# Patient Record
Sex: Female | Born: 1975 | Race: White | Hispanic: No | Smoking: Current every day smoker
Health system: Southern US, Community
[De-identification: ages and names within clinical notes are randomized; demographics above are authoritative.]

## PROBLEM LIST (undated history)

## (undated) DIAGNOSIS — K219 Gastro-esophageal reflux disease without esophagitis: Secondary | ICD-10-CM

## (undated) DIAGNOSIS — F419 Anxiety disorder, unspecified: Secondary | ICD-10-CM

## (undated) DIAGNOSIS — C539 Malignant neoplasm of cervix uteri, unspecified: Secondary | ICD-10-CM

## (undated) DIAGNOSIS — K529 Noninfective gastroenteritis and colitis, unspecified: Secondary | ICD-10-CM

## (undated) DIAGNOSIS — F1120 Opioid dependence, uncomplicated: Secondary | ICD-10-CM

## (undated) HISTORY — PX: CERVICAL CONE BIOPSY: SUR198

## (undated) HISTORY — DX: Malignant neoplasm of cervix uteri, unspecified: C53.9

## (undated) HISTORY — DX: Gastro-esophageal reflux disease without esophagitis: K21.9

## (undated) HISTORY — DX: Anxiety disorder, unspecified: F41.9

## (undated) HISTORY — PX: LAPAROSCOPIC CHOLECYSTECTOMY: SUR755

## (undated) HISTORY — DX: Noninfective gastroenteritis and colitis, unspecified: K52.9

---

## 1997-12-08 ENCOUNTER — Ambulatory Visit (HOSPITAL_COMMUNITY): Admission: RE | Admit: 1997-12-08 | Discharge: 1997-12-09 | Payer: Self-pay | Admitting: Sports Medicine

## 1997-12-22 ENCOUNTER — Ambulatory Visit (HOSPITAL_COMMUNITY): Admission: RE | Admit: 1997-12-22 | Discharge: 1997-12-22 | Payer: Self-pay | Admitting: Sports Medicine

## 1998-01-01 ENCOUNTER — Emergency Department (HOSPITAL_COMMUNITY): Admission: EM | Admit: 1998-01-01 | Discharge: 1998-01-01 | Payer: Self-pay | Admitting: *Deleted

## 1998-01-13 ENCOUNTER — Ambulatory Visit (HOSPITAL_COMMUNITY): Admission: RE | Admit: 1998-01-13 | Discharge: 1998-01-13 | Payer: Self-pay | Admitting: Sports Medicine

## 1999-06-06 ENCOUNTER — Other Ambulatory Visit: Admission: RE | Admit: 1999-06-06 | Discharge: 1999-06-06 | Payer: Self-pay | Admitting: Obstetrics and Gynecology

## 1999-08-04 ENCOUNTER — Encounter: Payer: Self-pay | Admitting: Obstetrics and Gynecology

## 1999-08-04 ENCOUNTER — Ambulatory Visit (HOSPITAL_COMMUNITY): Admission: RE | Admit: 1999-08-04 | Discharge: 1999-08-04 | Payer: Self-pay | Admitting: Obstetrics and Gynecology

## 1999-10-25 ENCOUNTER — Encounter: Payer: Self-pay | Admitting: Obstetrics and Gynecology

## 1999-10-25 ENCOUNTER — Ambulatory Visit (HOSPITAL_COMMUNITY): Admission: RE | Admit: 1999-10-25 | Discharge: 1999-10-25 | Payer: Self-pay | Admitting: Obstetrics and Gynecology

## 1999-12-26 ENCOUNTER — Encounter: Payer: Self-pay | Admitting: Obstetrics and Gynecology

## 1999-12-26 ENCOUNTER — Ambulatory Visit (HOSPITAL_COMMUNITY): Admission: RE | Admit: 1999-12-26 | Discharge: 1999-12-26 | Payer: Self-pay | Admitting: Obstetrics and Gynecology

## 1999-12-28 ENCOUNTER — Inpatient Hospital Stay (HOSPITAL_COMMUNITY): Admission: AD | Admit: 1999-12-28 | Discharge: 1999-12-30 | Payer: Self-pay | Admitting: Obstetrics and Gynecology

## 2000-06-14 ENCOUNTER — Ambulatory Visit (HOSPITAL_COMMUNITY): Admission: RE | Admit: 2000-06-14 | Discharge: 2000-06-14 | Payer: Self-pay | Admitting: Interventional Cardiology

## 2001-06-21 ENCOUNTER — Other Ambulatory Visit: Admission: RE | Admit: 2001-06-21 | Discharge: 2001-06-21 | Payer: Self-pay | Admitting: Obstetrics and Gynecology

## 2002-05-01 ENCOUNTER — Encounter: Payer: Self-pay | Admitting: Family Medicine

## 2002-05-01 ENCOUNTER — Ambulatory Visit (HOSPITAL_COMMUNITY): Admission: RE | Admit: 2002-05-01 | Discharge: 2002-05-01 | Payer: Self-pay | Admitting: Family Medicine

## 2002-07-04 ENCOUNTER — Other Ambulatory Visit: Admission: RE | Admit: 2002-07-04 | Discharge: 2002-07-04 | Payer: Self-pay | Admitting: Obstetrics and Gynecology

## 2003-05-19 ENCOUNTER — Ambulatory Visit (HOSPITAL_COMMUNITY): Admission: RE | Admit: 2003-05-19 | Discharge: 2003-05-19 | Payer: Self-pay | Admitting: Family Medicine

## 2003-05-22 ENCOUNTER — Ambulatory Visit (HOSPITAL_COMMUNITY): Admission: RE | Admit: 2003-05-22 | Discharge: 2003-05-22 | Payer: Self-pay | Admitting: Family Medicine

## 2003-06-10 ENCOUNTER — Ambulatory Visit (HOSPITAL_COMMUNITY): Admission: RE | Admit: 2003-06-10 | Discharge: 2003-06-10 | Payer: Self-pay | Admitting: Internal Medicine

## 2004-03-11 ENCOUNTER — Ambulatory Visit (HOSPITAL_COMMUNITY): Admission: RE | Admit: 2004-03-11 | Discharge: 2004-03-11 | Payer: Self-pay | Admitting: General Surgery

## 2004-04-22 ENCOUNTER — Other Ambulatory Visit: Admission: RE | Admit: 2004-04-22 | Discharge: 2004-04-22 | Payer: Self-pay | Admitting: Obstetrics and Gynecology

## 2005-05-09 ENCOUNTER — Other Ambulatory Visit: Admission: RE | Admit: 2005-05-09 | Discharge: 2005-05-09 | Payer: Self-pay

## 2008-01-09 ENCOUNTER — Encounter (HOSPITAL_COMMUNITY): Admission: RE | Admit: 2008-01-09 | Discharge: 2008-02-08 | Payer: Self-pay | Admitting: Endocrinology

## 2008-03-11 ENCOUNTER — Emergency Department (HOSPITAL_COMMUNITY): Admission: EM | Admit: 2008-03-11 | Discharge: 2008-03-11 | Payer: Self-pay | Admitting: Emergency Medicine

## 2009-08-12 ENCOUNTER — Ambulatory Visit (HOSPITAL_COMMUNITY): Admission: RE | Admit: 2009-08-12 | Discharge: 2009-08-12 | Payer: Self-pay | Admitting: Family Medicine

## 2010-03-02 ENCOUNTER — Other Ambulatory Visit: Payer: Self-pay | Admitting: Emergency Medicine

## 2010-03-02 ENCOUNTER — Inpatient Hospital Stay (HOSPITAL_COMMUNITY): Admission: AD | Admit: 2010-03-02 | Discharge: 2010-03-04 | Payer: Self-pay | Admitting: Psychiatry

## 2010-03-02 ENCOUNTER — Ambulatory Visit: Payer: Self-pay | Admitting: Psychiatry

## 2010-03-02 ENCOUNTER — Emergency Department (HOSPITAL_COMMUNITY): Admission: EM | Admit: 2010-03-02 | Discharge: 2010-03-02 | Payer: Self-pay | Admitting: Emergency Medicine

## 2010-09-15 LAB — RAPID URINE DRUG SCREEN, HOSP PERFORMED
Amphetamines: NOT DETECTED
Opiates: NOT DETECTED
Tetrahydrocannabinol: POSITIVE — AB

## 2010-09-15 LAB — BASIC METABOLIC PANEL
Calcium: 9.3 mg/dL (ref 8.4–10.5)
Creatinine, Ser: 0.74 mg/dL (ref 0.4–1.2)
GFR calc Af Amer: >60 mL/min (ref >60)
GFR calc non Af Amer: >60 mL/min (ref >60)
Sodium: 136 mEq/L (ref 135–145)

## 2010-09-15 LAB — CBC
Hemoglobin: 15.2 g/dL — ABNORMAL HIGH (ref 12.0–15.0)
Platelets: 217 10*3/uL (ref 150–400)
RBC: 4.76 MIL/uL (ref 3.87–5.11)
WBC: 10 10*3/uL (ref 4.0–10.5)

## 2010-09-15 LAB — DIFFERENTIAL
Eosinophils Absolute: 0 10*3/uL (ref 0.0–0.7)
Lymphocytes Relative: 20 % (ref 12–46)
Lymphs Abs: 2 10*3/uL (ref 0.7–4.0)
Monocytes Relative: 7 % (ref 3–12)
Neutro Abs: 7.3 10*3/uL (ref 1.7–7.7)
Neutrophils Relative %: 73 % (ref 43–77)

## 2010-09-15 LAB — ETHANOL: Alcohol, Ethyl (B): <5 mg/dL (ref 0–10)

## 2010-09-15 LAB — POTASSIUM: Potassium: 3.3 mEq/L — ABNORMAL LOW (ref 3.5–5.1)

## 2010-10-12 ENCOUNTER — Emergency Department (HOSPITAL_COMMUNITY): Payer: Medicaid Other

## 2010-10-12 ENCOUNTER — Emergency Department (HOSPITAL_COMMUNITY)
Admission: EM | Admit: 2010-10-12 | Discharge: 2010-10-12 | Disposition: A | Discharge status: Home / Self Care | Payer: Medicaid Other | Attending: Emergency Medicine | Admitting: Emergency Medicine

## 2010-10-12 DIAGNOSIS — R109 Unspecified abdominal pain: Secondary | ICD-10-CM | POA: Insufficient documentation

## 2010-10-12 LAB — URINALYSIS, ROUTINE W REFLEX MICROSCOPIC
Glucose, UA: NEGATIVE mg/dL
Protein, ur: NEGATIVE mg/dL
Specific Gravity, Urine: >1.03 — ABNORMAL HIGH (ref 1.005–1.030)

## 2010-10-12 LAB — COMPREHENSIVE METABOLIC PANEL
AST: 13 U/L (ref 0–37)
Albumin: 4 g/dL (ref 3.5–5.2)
Alkaline Phosphatase: 57 U/L (ref 39–117)
Chloride: 105 mEq/L (ref 96–112)
GFR calc Af Amer: >60 mL/min (ref >60)
Potassium: 3.5 mEq/L (ref 3.5–5.1)
Total Bilirubin: 0.9 mg/dL (ref 0.3–1.2)

## 2010-10-12 LAB — DIFFERENTIAL
Basophils Relative: 0 % (ref 0–1)
Eosinophils Absolute: 0 10*3/uL (ref 0.0–0.7)
Lymphs Abs: 0.7 10*3/uL (ref 0.7–4.0)
Neutro Abs: 9.7 10*3/uL — ABNORMAL HIGH (ref 1.7–7.7)
Neutrophils Relative %: 89 % — ABNORMAL HIGH (ref 43–77)

## 2010-10-12 LAB — CBC
Hemoglobin: 14.7 g/dL (ref 12.0–15.0)
MCV: 89.8 fL (ref 78.0–100.0)
Platelets: 161 10*3/uL (ref 150–400)
RBC: 4.63 MIL/uL (ref 3.87–5.11)
WBC: 11 10*3/uL — ABNORMAL HIGH (ref 4.0–10.5)

## 2010-10-13 ENCOUNTER — Emergency Department (HOSPITAL_COMMUNITY)
Admission: EM | Admit: 2010-10-13 | Discharge: 2010-10-14 | Disposition: A | Discharge status: Home / Self Care | Payer: Medicaid Other | Attending: Emergency Medicine | Admitting: Emergency Medicine

## 2010-10-13 ENCOUNTER — Ambulatory Visit (INDEPENDENT_AMBULATORY_CARE_PROVIDER_SITE_OTHER): Payer: Medicaid Other | Admitting: Gastroenterology

## 2010-10-13 ENCOUNTER — Encounter: Payer: Self-pay | Admitting: Gastroenterology

## 2010-10-13 ENCOUNTER — Emergency Department (HOSPITAL_COMMUNITY): Payer: Medicaid Other

## 2010-10-13 VITALS — BP 107/74 | HR 93 | Temp 98.7°F | Ht 65.0 in | Wt 115.0 lb

## 2010-10-13 DIAGNOSIS — R197 Diarrhea, unspecified: Secondary | ICD-10-CM | POA: Insufficient documentation

## 2010-10-13 DIAGNOSIS — F329 Major depressive disorder, single episode, unspecified: Secondary | ICD-10-CM | POA: Insufficient documentation

## 2010-10-13 DIAGNOSIS — F3289 Other specified depressive episodes: Secondary | ICD-10-CM | POA: Insufficient documentation

## 2010-10-13 DIAGNOSIS — K219 Gastro-esophageal reflux disease without esophagitis: Secondary | ICD-10-CM | POA: Insufficient documentation

## 2010-10-13 DIAGNOSIS — R110 Nausea: Secondary | ICD-10-CM | POA: Insufficient documentation

## 2010-10-13 DIAGNOSIS — R109 Unspecified abdominal pain: Secondary | ICD-10-CM | POA: Insufficient documentation

## 2010-10-13 DIAGNOSIS — R1013 Epigastric pain: Secondary | ICD-10-CM

## 2010-10-13 DIAGNOSIS — R10813 Right lower quadrant abdominal tenderness: Secondary | ICD-10-CM | POA: Insufficient documentation

## 2010-10-13 LAB — COMPREHENSIVE METABOLIC PANEL
AST: 26 U/L (ref 0–37)
Albumin: 4 g/dL (ref 3.5–5.2)
CO2: 20 mEq/L (ref 19–32)
Calcium: 8.7 mg/dL (ref 8.4–10.5)
Creatinine, Ser: 0.85 mg/dL (ref 0.4–1.2)
GFR calc Af Amer: >60 mL/min (ref >60)
GFR calc non Af Amer: >60 mL/min (ref >60)

## 2010-10-13 LAB — CBC
MCH: 31.8 pg (ref 26.0–34.0)
MCHC: 35.7 g/dL (ref 30.0–36.0)
Platelets: 151 10*3/uL (ref 150–400)
RDW: 13.6 % (ref 11.5–15.5)

## 2010-10-13 LAB — DIFFERENTIAL
Basophils Relative: 0 % (ref 0–1)
Eosinophils Absolute: 0 10*3/uL (ref 0.0–0.7)
Eosinophils Relative: 0 % (ref 0–5)
Monocytes Absolute: 0.5 10*3/uL (ref 0.1–1.0)
Monocytes Relative: 6 % (ref 3–12)

## 2010-10-13 NOTE — Patient Instructions (Signed)
Complete stool sample. May take imodium as needed Stop Prilosec. Start Dexilant 60 mg daily. If this shows improvement, activate the savings card and contact our office so we may call in full prescription You have been set up for an upper endoscopy. If you have any severe nausea/vomiting, severe abdominal pain, vomiting blood, fever, seek medical attention immediately.

## 2010-10-13 NOTE — Progress Notes (Signed)
Referring Provider: Ernestine Conrad, MD Primary Care Physician:  Ernestine Conrad, MD Primary Gastroenterologist:  Dr.  Chief Complaint  Patient presents with  . Abdominal Pain  . Diarrhea    HPI:  [name] is a 35 y.o. female here as a referral from Dr. Loney Hering for +reflux, severe,  omeprazole 40 mg BID for over 1 year, Tums 2-3 per day as well. Severe epigastric discomfort, burning, stabbing like a knife, intermittent churning. Has had diarrhea 10 times yesterday. Recently on flagyl for BV, off for three days. Afebrile, but feels cold. Worsened by food yesterday; however, sometimes would make it feel better. No dysphagia, odynophagia. +nausea thinking about food. Smells make nauseated. Does report scant amount of hematochezia in stool/paper after bout of constipation/impaction a few months ago. Total of 3 times. No change in bowel habits. No other signs of brbpr.  Chronic epigastric pain/reflux X 15 years.   10/2010: Lipase 24, CMP, LFTS nl.  Stable wt.   2004: EGD with Dr. Jena Gauss: normal, subsequently underwent lap chole.      Current Outpatient Prescriptions  Medication Sig Dispense Refill  . calcium carbonate (TUMS - DOSED IN MG ELEMENTAL CALCIUM) 500 MG chewable tablet Chew 1 tablet by mouth daily as needed.        Clelia Schaumann Estrad 91-Day (CAMRESE LO PO) Take 1 tablet by mouth daily.        Marland Kitchen omeprazole (PRILOSEC) 40 MG capsule Take 40 mg by mouth 2 (two) times daily.          Allergies as of 10/13/2010  . (No Known Allergies)    Past Medical History  Diagnosis Date  . GERD (gastroesophageal reflux disease)   . Anxiety   . Cervical ca     [id number]    Past Surgical History  Procedure Date  . Laparoscopic cholecystectomy   . Cervical cone biopsy      History   Social History  . Marital Status: Legally Separated    Spouse Name: N/A    Number of Children: N/A  . Years of Education: N/A   Occupational History  . Not on file.   Social History Main Topics  .  Smoking status: Current Everyday Smoker -- 1.0 packs/day    Review of Systems: Gen: Denies any fever, chills, sweats, fatigue, weakness, malaise, weight loss, and sleep disorder CV: Denies chest pain, angina, palpitations, syncope, orthopnea, PND, peripheral edema, and claudication. Resp: Denies dyspnea at rest, dyspnea with exercise, cough, sputum, wheezing, coughing up blood, and pleurisy. GI: See HPI GU : Denies urinary burning, blood in urine, urinary frequency, urinary hesitancy, nocturnal urination, and urinary incontinence. MS: Denies joint pain, limitation of movement, and swelling, stiffness, low back pain, extremity pain. Denies muscle weakness, cramps, atrophy.  Derm: Denies rash, itching, dry skin, hives, moles, warts, or unhealing ulcers.  Psych: Denies depression, anxiety, memory loss, suicidal ideation, hallucinations, paranoia, and confusion. Heme: Denies bruising, bleeding, and enlarged lymph nodes.  Physical Exam: BP 107/74  Pulse 93  Temp(Src) 98.7 F (37.1 C) (Tympanic)  Ht 5\' 5"  (1.651 m)  Wt 115 lb (52.164 kg)  BMI 19.14 kg/m2  LMP 09/21/2010 General:   Alert,  Somewhat thin-appearing but healthy. Appears older than stated age Head:  Normocephalic and atraumatic. Eyes:  Sclera clear, no icterus.   Conjunctiva pink. Ears:  Normal auditory acuity. Nose:  No deformity, discharge,  or lesions. Mouth:  No deformity or lesions, dentition normal. Neck:  Supple; no masses or thyromegaly. Lungs:  Clear throughout  to auscultation.   No wheezes, crackles, or rhonchi. No acute distress. Heart:  Regular rate and rhythm; no murmurs, clicks, rubs,  or gallops. Abdomen:  Soft, mild tenderness to palpation epigastric region,  nondistended. No masses, hepatosplenomegaly or hernias noted. Normal bowel sounds, without guarding, and without rebound.   Rectal:  Deferred  Msk:  Symmetrical without gross deformities. Normal posture. Extremities:  Without clubbing or  edema. Neurologic:  Alert and  oriented x4;  grossly normal neurologically. Skin:  Intact without significant lesions or rashes. Cervical Nodes:  No significant cervical adenopathy. Psych:  Alert and cooperative. Normal mood and affect.

## 2010-10-14 MED ORDER — IOHEXOL 300 MG/ML  SOLN
100.0000 mL | Freq: Once | INTRAMUSCULAR | Status: AC | PRN
  Administered 2010-10-14: 100 mL via INTRAVENOUS

## 2010-10-15 ENCOUNTER — Inpatient Hospital Stay (HOSPITAL_COMMUNITY)
Admission: EM | Admit: 2010-10-15 | Discharge: 2010-10-19 | DRG: 392 | Disposition: A | Discharge status: Home / Self Care | Payer: Medicaid Other | Attending: Otolaryngology | Admitting: Otolaryngology

## 2010-10-15 DIAGNOSIS — E860 Dehydration: Secondary | ICD-10-CM | POA: Diagnosis present

## 2010-10-15 DIAGNOSIS — K219 Gastro-esophageal reflux disease without esophagitis: Secondary | ICD-10-CM | POA: Diagnosis present

## 2010-10-15 DIAGNOSIS — F17200 Nicotine dependence, unspecified, uncomplicated: Secondary | ICD-10-CM | POA: Diagnosis present

## 2010-10-15 DIAGNOSIS — A09 Infectious gastroenteritis and colitis, unspecified: Principal | ICD-10-CM | POA: Diagnosis present

## 2010-10-15 DIAGNOSIS — E876 Hypokalemia: Secondary | ICD-10-CM | POA: Diagnosis present

## 2010-10-15 LAB — DIFFERENTIAL
Basophils Absolute: 0 10*3/uL (ref 0.0–0.1)
Lymphocytes Relative: 19 % (ref 12–46)
Monocytes Absolute: 0.7 10*3/uL (ref 0.1–1.0)
Neutro Abs: 3.6 10*3/uL (ref 1.7–7.7)

## 2010-10-15 LAB — COMPREHENSIVE METABOLIC PANEL
ALT: 15 U/L (ref 0–35)
Alkaline Phosphatase: 59 U/L (ref 39–117)
CO2: 24 mEq/L (ref 19–32)
Chloride: 97 mEq/L (ref 96–112)
GFR calc non Af Amer: >60 mL/min (ref >60)
Glucose, Bld: 105 mg/dL — ABNORMAL HIGH (ref 70–99)
Potassium: 2.8 mEq/L — ABNORMAL LOW (ref 3.5–5.1)
Sodium: 134 mEq/L — ABNORMAL LOW (ref 135–145)
Total Bilirubin: 0.8 mg/dL (ref 0.3–1.2)

## 2010-10-15 LAB — URINALYSIS, ROUTINE W REFLEX MICROSCOPIC
Ketones, ur: >80 mg/dL — AB
Nitrite: NEGATIVE
Urobilinogen, UA: 0.2 mg/dL (ref 0.0–1.0)

## 2010-10-15 LAB — CBC
HCT: 45 % (ref 36.0–46.0)
Hemoglobin: 17 g/dL — ABNORMAL HIGH (ref 12.0–15.0)
MCHC: 37.8 g/dL — ABNORMAL HIGH (ref 30.0–36.0)
WBC: 5.3 10*3/uL (ref 4.0–10.5)

## 2010-10-15 LAB — LIPASE, BLOOD: Lipase: 48 U/L (ref 11–59)

## 2010-10-15 LAB — URINE MICROSCOPIC-ADD ON

## 2010-10-16 ENCOUNTER — Encounter: Payer: Self-pay | Admitting: Gastroenterology

## 2010-10-16 DIAGNOSIS — R197 Diarrhea, unspecified: Secondary | ICD-10-CM | POA: Insufficient documentation

## 2010-10-16 DIAGNOSIS — R1013 Epigastric pain: Secondary | ICD-10-CM | POA: Insufficient documentation

## 2010-10-16 DIAGNOSIS — K5289 Other specified noninfective gastroenteritis and colitis: Secondary | ICD-10-CM

## 2010-10-16 LAB — CBC
HCT: 37 % (ref 36.0–46.0)
MCHC: 36.2 g/dL — ABNORMAL HIGH (ref 30.0–36.0)
Platelets: 148 10*3/uL — ABNORMAL LOW (ref 150–400)
RDW: 13.7 % (ref 11.5–15.5)
WBC: 5.6 10*3/uL (ref 4.0–10.5)

## 2010-10-16 LAB — COMPREHENSIVE METABOLIC PANEL
ALT: 12 U/L (ref 0–35)
AST: 13 U/L (ref 0–37)
Albumin: 3 g/dL — ABNORMAL LOW (ref 3.5–5.2)
Alkaline Phosphatase: 44 U/L (ref 39–117)
Calcium: 7.8 mg/dL — ABNORMAL LOW (ref 8.4–10.5)
GFR calc Af Amer: >60 mL/min (ref >60)
Glucose, Bld: 94 mg/dL (ref 70–99)
Potassium: 3.4 mEq/L — ABNORMAL LOW (ref 3.5–5.1)
Sodium: 137 mEq/L (ref 135–145)
Total Protein: 5.4 g/dL — ABNORMAL LOW (ref 6.0–8.3)

## 2010-10-16 LAB — MAGNESIUM: Magnesium: 2 mg/dL (ref 1.5–2.5)

## 2010-10-16 LAB — CLOSTRIDIUM DIFFICILE BY PCR: Toxigenic C. Difficile by PCR: NEGATIVE

## 2010-10-16 LAB — TSH: TSH: 2.156 u[IU]/mL (ref 0.350–4.500)

## 2010-10-16 LAB — DIFFERENTIAL
Basophils Absolute: 0 10*3/uL (ref 0.0–0.1)
Basophils Relative: 0 % (ref 0–1)
Eosinophils Relative: 1 % (ref 0–5)
Lymphocytes Relative: 33 % (ref 12–46)
Monocytes Absolute: 1.1 10*3/uL — ABNORMAL HIGH (ref 0.1–1.0)

## 2010-10-16 LAB — PREGNANCY, URINE: Preg Test, Ur: NEGATIVE

## 2010-10-16 NOTE — Assessment & Plan Note (Signed)
35 year old Caucasian female with hx of chronic epigastric pain, now worsening over past year with increasing reflux, burning epigastric pain, despite BID omeprazole. EGD in 2004 with no significant findings. Recent lipase, CMP, LFTs wnl. May be component of gastritis, PUD; however, question of functional abdominal pain. Will undergo EGD with Dr. Jena Gauss in near future.   EGD with Dr. Jena Gauss in very near future: the R/B/A have been discussed in detail with pt; she states understanding and desires to proceed. Stop omeprazole, begin Dexilant. Samples given and savings card

## 2010-10-16 NOTE — Assessment & Plan Note (Signed)
New onset of diarrhea in the setting of recent abx. Doubt Cdiff, but will check stool sample for completeness. Likely self-limiting. As of note, pt with remote hx of small amount hematochezia in setting of constipation/impaction. Instructed pt to monitor for any additional signs of brbpr. No FH of colon ca; likely benign anorectal source. Will hold off on colonoscopy for now as no additional episodes. Pt aware.

## 2010-10-17 ENCOUNTER — Encounter: Payer: Medicaid Other | Admitting: Internal Medicine

## 2010-10-17 DIAGNOSIS — R112 Nausea with vomiting, unspecified: Secondary | ICD-10-CM

## 2010-10-17 DIAGNOSIS — K5289 Other specified noninfective gastroenteritis and colitis: Secondary | ICD-10-CM

## 2010-10-17 DIAGNOSIS — R197 Diarrhea, unspecified: Secondary | ICD-10-CM

## 2010-10-17 LAB — HEPATIC FUNCTION PANEL
ALT: 12 U/L (ref 0–35)
AST: 14 U/L (ref 0–37)
Albumin: 3.1 g/dL — ABNORMAL LOW (ref 3.5–5.2)
Total Bilirubin: 0.4 mg/dL (ref 0.3–1.2)

## 2010-10-17 LAB — CBC
HCT: 36.5 % (ref 36.0–46.0)
Platelets: 160 10*3/uL (ref 150–400)
RBC: 4.18 MIL/uL (ref 3.87–5.11)
RDW: 13.7 % (ref 11.5–15.5)
WBC: 4.7 10*3/uL (ref 4.0–10.5)

## 2010-10-17 LAB — DIFFERENTIAL
Basophils Absolute: 0 10*3/uL (ref 0.0–0.1)
Eosinophils Relative: 1 % (ref 0–5)
Lymphocytes Relative: 23 % (ref 12–46)
Lymphs Abs: 1.1 10*3/uL (ref 0.7–4.0)
Neutrophils Relative %: 67 % (ref 43–77)

## 2010-10-17 LAB — BASIC METABOLIC PANEL
BUN: 2 mg/dL — ABNORMAL LOW (ref 6–23)
Chloride: 108 mEq/L (ref 96–112)
GFR calc Af Amer: >60 mL/min (ref >60)
GFR calc non Af Amer: >60 mL/min (ref >60)
Potassium: 3.5 mEq/L (ref 3.5–5.1)
Sodium: 137 mEq/L (ref 135–145)

## 2010-10-17 LAB — LIPASE, BLOOD: Lipase: 136 U/L — ABNORMAL HIGH (ref 11–59)

## 2010-10-17 NOTE — H&P (Signed)
[name], [name]                 ACCOUNT NO.:  [id number]  MEDICAL RECORD NO.:  [id number]           PATIENT TYPE:  E  LOCATION:  [facility]                          FACILITY:  [facility]  PHYSICIAN:  Osvaldo Shipper, MD     DATE OF BIRTH:  Nov 05, 1975  DATE OF ADMISSION:  10/15/2010 DATE OF DISCHARGE:  LH                             HISTORY & PHYSICAL   PRIMARY CARE PHYSICIAN:  Dr. Loney Hering in [place], [place].  She has seen Dr. Jena Gauss in the past and saw his PA on Thursday.  ADMISSION DIAGNOSES: 1. Enteritis/colitis, possible inflammatory bowel disease versus     infectious etiology. 2. Hypokalemia. 3. Dehydration.  CHIEF COMPLAINT:  Diarrhea, nausea, vomiting, and abdominal pain since Wednesday.  HISTORY OF PRESENT ILLNESS:  The patient is a 35 year old Caucasian female who has a past medical history of acid reflux disease and depression who was in her usual state of health until Wednesday morning when she woke up with pain in her abdomen.  She describes this as a burning sensation.  She took her Prilosec with no relief.  She tells me that she may have eaten at [employer] the previous day, but she did have breakfast at [employer] on Wednesday morning.  However, she continued to get cramping pain in her belly on and off, started having nausea with vomiting, decreased appetite.  She started having diarrhea which was greenish stool, watery with no blood.  These symptoms persisted, so she went to the ED on Wednesday night, was given symptomatic treatment, was prescribed Zofran, Vicodin, and metronidazole, and was discharged home. She continued to have these symptoms on Wednesday night.  Thursday morning, she felt the same and went to Dr. Luvenia Starch office and they were planning to schedule an endoscopy.  However, Thursday, she continued to feel poorly, so she came back into the ED, was again given symptomatic treatment, and was sent home.  The symptoms persisted on Friday and then today as  well and she decided to come back into the hospital.  She feels like she has lost a lot of weight in the last 4 days.  Denies any fever, however, has been feeling hot and cold alternating and as well as sweating profusely in the last couple of days.  Interestingly, she tells me that 6 months ago she saw blood in her stool on a few occasions, but could not seek attention because she did not have insurance at that time.  Has not seen any blood in the stool recently.  The pain is 10/10 in intensity with no aggravating or relieving factors.  No precipitant factors identified.  No radiation of the pain.  MEDICATIONS AT HOME:  She is on Dexilant 60 mg daily and Camrese which is a birth control pill and then she was prescribed Zofran, hydrocodone, acetaminophen, and metronidazole by the ED on Wednesday.  ALLERGIES:  No known drug allergies.  PAST MEDICAL HISTORY:  Positive for GERD.  She tells me she had an endoscopy about a year and half ago done by Dr. Jena Gauss.  She actually had it in 2004 and this showed normal  esophagus, stomach, and duodenum.  She has had gallbladder taken out in 2005, had some kind of cervical procedure for cancer in 1994.  She has never had a colonoscopy.  SOCIAL HISTORY:  Lives in [place] with her fiance, works Arts development officer, smokes 1 pack of cigarettes on a daily basis.  No alcohol use.  No illicit drug use.  FAMILY HISTORY:  Unremarkable per the patient.  REVIEW OF SYSTEMS:  GENERAL:  Positive for weakness and malaise.  HEENT: Unremarkable.  CARDIOVASCULAR:  Unremarkable.  GI:  As in HPI.  GU: Unremarkable.  NEUROLOGIC:  Unremarkable.  PSYCHIATRIC:  Unremarkable. DERMATOLOGICAL:  Unremarkable.  Other systems are reviewed and found to be negative.  PHYSICAL EXAMINATION:  VITAL SIGNS:  Temperature 97.6, blood pressure 111/82, heart rate 99, respiratory rate 22, and saturation 98% on room air. GENERAL:  A thin white female in no distress. HEENT:  Head  is normocephalic and atraumatic.  Pupils are equal, reacting.  No pallor.  No icterus.  Oral mucous membranes are dry.  No oral lesions are noted. NECK:  Soft and supple.  No thyromegaly appreciated.  No cervical, supraclavicular, or inguinal lymphadenopathy is present. LUNGS:  Anteriorly are clear to auscultation bilaterally. CARDIOVASCULAR:  S1 and S2 are normal, regular.  No S3, S4, murmurs, or bruit. ABDOMEN:  Soft, nondistended.  Tender diffusely.  No rebound.  Minimal guarding.  No masses or organomegaly is appreciated. GU:  Deferred. MUSCULOSKELETAL:  Normal muscle mass and tone. NEUROLOGICAL:  She is alert and oriented x3.  No focal neurological deficits are present. SKIN:  Does not reveal any rashes.  Please note her last menstrual period, she started having her periods yesterday.  LABORATORY DATA:  When she came in on the 11th, her white cell count was 11.0 with 89% neutrophils.  Today white cell count is 5.3, hemoglobin 17.0, and platelet count is 174.  Sodium is 134, potassium is 2.8, chloride is 97, bicarb is 24, glucose is 105, BUN is 11, creatinine 0.65.  LFTs are normal.  Lipase is 48.  UA shows amber urine, specificgravity 1.025, moderate bilirubin, some ketones, trace blood, some protein, few squamous epithelial cells, granular casts, some mucus.  IMAGING STUDIES:  She had a CT of her abdomen and pelvis on October 14, 2010, and this showed mild diffuse wall thickening and stranding along the distal ileum with slight sparing of the terminal ileum.  Focal segmental wall thickening of the hepatic flexure of the colon was also seen.  ASSESSMENT:  This is a 35 year old Caucasian female who has a history of acid reflux who presents with nausea, vomiting, diarrhea, and abdominal pain since Wednesday.  She has abnormal CT findings.  This could be colitis, could be inflammatory bowel disease, could be infectious.  She has failed outpatient treatment and needs to be admitted  for IV hydration and a consultation with GI and possible endoscopy.  PLAN: 1. Enteritis/colitis.  We will send stool studies.  We will put her on     Cipro and Flagyl for now.  We will check ESR.  Consult GI. 2. Put her on PPI. 3. For dehydration, give her IV fluids.  Hypokalemia, we will replete     it intravenously.  Check a magnesium level in the morning.  We will     also check a urine pregnancy test as it has not been done in the     last 3 days.  She is a full code.  DVT prophylaxis.  SCDs for  now.  Further management decisions will depend on results of further testing and patient's response to treatment.   Osvaldo Shipper, MD     GK/MEDQ  D:  10/15/2010  T:  10/16/2010  Job:  [id number]  cc:   R. Roetta Sessions, M.D. [address] [place] [address]  Dierdre Highman Loney Hering, MD [address] [place], [place] [address]  Electronically Signed by Osvaldo Shipper MD on 10/16/2010 08:55:24 PM

## 2010-10-18 ENCOUNTER — Telehealth: Payer: Self-pay | Admitting: Urgent Care

## 2010-10-18 LAB — DIFFERENTIAL
Eosinophils Absolute: 0.1 10*3/uL (ref 0.0–0.7)
Eosinophils Relative: 2 % (ref 0–5)
Lymphocytes Relative: 42 % (ref 12–46)
Lymphs Abs: 2.6 10*3/uL (ref 0.7–4.0)
Monocytes Absolute: 0.6 10*3/uL (ref 0.1–1.0)
Monocytes Relative: 10 % (ref 3–12)

## 2010-10-18 LAB — BASIC METABOLIC PANEL
BUN: 3 mg/dL — ABNORMAL LOW (ref 6–23)
CO2: 24 mEq/L (ref 19–32)
Glucose, Bld: 106 mg/dL — ABNORMAL HIGH (ref 70–99)
Potassium: 3.4 mEq/L — ABNORMAL LOW (ref 3.5–5.1)
Sodium: 137 mEq/L (ref 135–145)

## 2010-10-18 LAB — FECAL LACTOFERRIN, QUANT: Fecal Lactoferrin: POSITIVE

## 2010-10-18 LAB — CBC
HCT: 36.1 % (ref 36.0–46.0)
MCH: 30.8 pg (ref 26.0–34.0)
MCHC: 35.2 g/dL (ref 30.0–36.0)
MCV: 87.6 fL (ref 78.0–100.0)
RDW: 13.6 % (ref 11.5–15.5)

## 2010-10-18 LAB — OVA AND PARASITE EXAMINATION

## 2010-10-18 NOTE — Telephone Encounter (Signed)
Patient is inpatient and he can hospital and should go home soon. Please call her and schedule a followup to be with me in 2 weeks to set up colonoscopy and EGD for diarrhea and hematochezia, chronic nausea and vomiting.

## 2010-10-19 NOTE — Telephone Encounter (Signed)
I spoke with [facility] nurse to give appt to pt and also mailed appt card to patient. OV is for 11/02/10 @ 930am with KJ

## 2010-10-21 LAB — STOOL CULTURE

## 2010-10-24 ENCOUNTER — Telehealth: Payer: Self-pay

## 2010-10-24 NOTE — Telephone Encounter (Signed)
Work in [employer] tomorrow To ER if worse in interim

## 2010-10-24 NOTE — Telephone Encounter (Signed)
Pt called- left voicemail- when discharged from hospital she was given an rx for oxycodone. She is still having abd pain and would like a refill. She has an appt with KJ on May 2nd and stated she needed something for pain until ov. Please advise.

## 2010-10-24 NOTE — Telephone Encounter (Signed)
Pt is aware of OV on 4/26 @ 0830 w/KJ

## 2010-10-24 NOTE — Telephone Encounter (Signed)
Explained to pt that we cannot call in narcotic pain meds over phone  She will need to be reevaluated  She agreed

## 2010-10-27 ENCOUNTER — Ambulatory Visit (INDEPENDENT_AMBULATORY_CARE_PROVIDER_SITE_OTHER): Payer: Medicaid Other | Admitting: Urgent Care

## 2010-10-27 ENCOUNTER — Other Ambulatory Visit: Payer: Self-pay | Admitting: Internal Medicine

## 2010-10-27 ENCOUNTER — Encounter: Payer: Self-pay | Admitting: Urgent Care

## 2010-10-27 VITALS — BP 101/61 | HR 86 | Temp 98.9°F | Ht 65.0 in | Wt 113.2 lb

## 2010-10-27 DIAGNOSIS — K5289 Other specified noninfective gastroenteritis and colitis: Secondary | ICD-10-CM

## 2010-10-27 DIAGNOSIS — R1013 Epigastric pain: Secondary | ICD-10-CM

## 2010-10-27 DIAGNOSIS — K219 Gastro-esophageal reflux disease without esophagitis: Secondary | ICD-10-CM

## 2010-10-27 DIAGNOSIS — K529 Noninfective gastroenteritis and colitis, unspecified: Secondary | ICD-10-CM

## 2010-10-27 DIAGNOSIS — R197 Diarrhea, unspecified: Secondary | ICD-10-CM

## 2010-10-27 MED ORDER — PEG 3350-KCL-NA BICARB-NACL 420 G PO SOLR: ORAL | Status: AC

## 2010-10-27 MED ORDER — HYOSCYAMINE SULFATE 0.125 MG PO TABS: 0.1250 mg | ORAL_TABLET | Freq: Three times a day (TID) | ORAL | Status: AC

## 2010-10-27 NOTE — Assessment & Plan Note (Signed)
Resolved at this point.  Suspect infectious enterocolitis but will undergo colonoscopy & EGD to rule out inflammatory bowel disease.  Completed course of antibiotics.   I have discussed risks & benefits which include, but are not limited to, bleeding, infection, perforation & drug reaction.  The patient agrees with this plan & written consent will be obtained.

## 2010-10-27 NOTE — Patient Instructions (Signed)
Begin Levsin 0.125mg  ac/hs as needed for abd pain Continue FLorastor Continue Dexilant 60mg  daily

## 2010-10-27 NOTE — Assessment & Plan Note (Signed)
With epigastric pain & refractory symptoms.  Much improvement w/ symptoms w/Dexilant 60mg  daily.  Will schedule EGD as planned.  I have discussed risks & benefits which include, but are not limited to, bleeding, infection, perforation & drug reaction.  The patient agrees with this plan & written consent will be obtained.

## 2010-10-27 NOTE — Assessment & Plan Note (Signed)
[name] is a 35 y/o caucasian female here for follow-up of recent enterocolitis & refractory GERD & to set up Colonoscopy to r/o inflammatory bowel disease. Colonoscopy w/ Dr Jena Gauss in the near future.  I have discussed risks & benefits which include, but are not limited to, bleeding, infection, perforation & drug reaction.  The patient agrees with this plan & written consent will be obtained.

## 2010-10-27 NOTE — Progress Notes (Signed)
Referring Provider: Ernestine Conrad, MD Primary Care Physician:  Ernestine Conrad, MD Primary Gastroenterologist:  Dr. Jena Gauss  Chief Complaint  Patient presents with  . Abdominal Pain    HPI:  [name] is a 35 y.o. female here for follow up from hospitalization for enterocolitis.  She feels much better.  Completed antibiotics.  Taking carafate & dexilant 60mg .  Poorly controlled GERD previously.  Doesn't have to take TUMS anymore.  When riding in her truck c/o LUQ/LLQ pain intermittent, "aggrevating" feeling, pain 5/10, lasts 20-30 mins a couple times per day.  Worse w/ bending.  Not changed w/ eating.  NO BM x 3days, previously watery diarrhea.  Has not taken anything for constipation.  Denies any recent rectal bleeding or melena.  Denies fever/chills.  C/o dysphagia or odynophagia.      Past Medical History  Diagnosis Date  . GERD (gastroesophageal reflux disease)   . Anxiety   . Cervical ca     [id number]  . Enterocolitis     admission 4/14-4/18    Past Surgical History  Procedure Date  . Laparoscopic cholecystectomy   . Cervical cone biopsy     Current Outpatient Prescriptions  Medication Sig Dispense Refill  . dexlansoprazole (DEXILANT) 60 MG capsule Take 60 mg by mouth daily.        Clelia Schaumann Estrad 91-Day (CAMRESE LO PO) Take 1 tablet by mouth daily.        Marland Kitchen saccharomyces boulardii (FLORASTOR) 250 MG capsule Take 250 mg by mouth 2 (two) times daily.        . sucralfate (CARAFATE) 1 GM/10ML suspension Take 1 g by mouth 4 (four) times daily.        . calcium carbonate (TUMS - DOSED IN MG ELEMENTAL CALCIUM) 500 MG chewable tablet Chew 1 tablet by mouth daily as needed.        . hyoscyamine (LEVSIN) 0.125 MG tablet Take 1 tablet (0.125 mg total) by mouth 4 (four) times daily -  before meals and at bedtime.  90 tablet  0  . DISCONTD: omeprazole (PRILOSEC) 40 MG capsule Take 40 mg by mouth 2 (two) times daily.          Allergies as of 10/27/2010  . (No Known Allergies)     Family History  Problem Relation Age of Onset  . GER disease Father   . GER disease Mother   . Colon polyps Mother   . Diverticulosis Mother     History   Social History  . Marital Status: Legally Separated    Spouse Name: N/A    Number of Children: N/A  . Years of Education: N/A   Social History Main Topics  . Smoking status: Current Everyday Smoker -- 1.0 packs/day for 15 years  . Smokeless tobacco: Not on file  . Alcohol Use: No  . Drug Use: No  . Sexually Active: Not on file   Review of Systems: Gen: Denies any fever, chills, sweats, anorexia, fatigue, weakness, malaise, weight loss, and sleep disorder CV: Denies chest pain, angina, palpitations, syncope, orthopnea, PND, peripheral edema, and claudication. Resp: Denies dyspnea at rest, dyspnea with exercise, cough, sputum, wheezing, coughing up blood, and pleurisy. GI: Denies vomiting blood, jaundice, and fecal incontinence.   Denies dysphagia or odynophagia. Derm: Denies rash, itching, dry skin, hives, moles, warts, or unhealing ulcers.  Psych: Denies depression, anxiety, memory loss, suicidal ideation, hallucinations, paranoia, and confusion. Heme: Denies bruising, bleeding, and enlarged lymph nodes.  Physical Exam: BP 101/61  Pulse  86  Temp(Src) 98.9 F (37.2 C) (Tympanic)  Ht 5\' 5"  (1.651 m)  Wt 113 lb 3.2 oz (51.347 kg)  BMI 18.84 kg/m2  LMP 09/21/2010 General:   Alert,  Well-developed, well-nourished, pleasant and cooperative in NAD Head:  Normocephalic and atraumatic. Eyes:  Sclera clear, no icterus.   Conjunctiva pink. Mouth:  No deformity or lesions, dentition normal. Neck:  Supple; no masses or thyromegaly. Heart:  Regular rate and rhythm; no murmurs, clicks, rubs,  or gallops. Abdomen:  Soft, nontender and nondistended. No masses, hepatosplenomegaly or hernias noted. Normal bowel sounds, without guarding, and without rebound.   Msk:  Symmetrical without gross deformities. Normal posture. Pulses:   Normal pulses noted. Extremities:  Without clubbing or edema. Neurologic:  Alert and  oriented x4;  grossly normal neurologically. Skin:  Intact without significant lesions or rashes. Cervical Nodes:  No significant cervical adenopathy. Psych:  Alert and cooperative. Normal mood and affect.

## 2010-10-27 NOTE — Progress Notes (Signed)
Cc to PCP 

## 2010-11-02 ENCOUNTER — Inpatient Hospital Stay: Payer: Medicaid Other | Admitting: Urgent Care

## 2010-11-03 ENCOUNTER — Other Ambulatory Visit: Payer: Self-pay

## 2010-11-03 MED ORDER — DEXLANSOPRAZOLE 60 MG PO CPDR: 60.0000 mg | DELAYED_RELEASE_CAPSULE | Freq: Every day | ORAL | Status: DC

## 2010-11-03 NOTE — Telephone Encounter (Signed)
Pt called- stated she was out of the samples that KJ gave her and is requesting refill on Dexilant ASAP. She stated she was having a lot of reflux today and would need them soon. Pt is at work in [place] and is requesting rx sent there [facility] of [facility].

## 2010-11-07 NOTE — Discharge Summary (Signed)
NAMELAURENCE, [name]                 ACCOUNT NO.:  [id number]  MEDICAL RECORD NO.:  [id number]           PATIENT TYPE:  I  LOCATION:  [facility]                          FACILITY:  [facility]  PHYSICIAN:  Brekyn Huntoon L. Lendell Caprice, MDDATE OF BIRTH:  11/17/1975  DATE OF ADMISSION:  10/15/2010 DATE OF DISCHARGE:  04/18/2012LH                              DISCHARGE SUMMARY   DISCHARGE DIAGNOSES: 1. Abdominal pain and diarrhea, most likely infectious enterocolitis. 2. Hypokalemia. 3. Dehydration. 4. Tobacco abuse, wishes to quit. 5. Gastroesophageal reflux disease.  DISCHARGE MEDICATIONS: 1. Change Flagyl to 500 mg p.o. t.i.d. to complete a 7-day course. 2. Cipro 500 mg p.o. b.i.d. to complete a 7-day course. 3. Tylenol 650 mg p.o. q.4 h. p.r.n. pain. 4. Levsin 0.125 mg under tongue before meals and at bedtime as needed     for abdominal cramps. 5. Nicotine patch 21 mg daily and taper as tolerated. 6. Oxycodone 5 mg 1 tablet by mouth every 6 hours as needed for severe     pain, 20 were dispensed with zero refills. 7. Florastor 250 mg daily. 8. Carafate 1 gram suspension p.o. q.i.d. 9. Continue Camrese 1 tablet daily. 10.Dexilant 60 mg a day. 11.Excedrin Migraine 2 tablets daily as needed for migraines. 12.Tums as needed. 13.Zofran 4 mg p.o. q.6 h. p.r.n. nausea.  CONDITION:  Stable.  ACTIVITY:  No driving while on pain medication.  Increase activity slowly.  DIET:  Low-fat, non dairy for a week or until symptoms resolve.  FOLLOWUP:  With Dr. Jena Gauss, she is to call for an appointment for an outpatient EGD and colonoscopy.  CONDITION:  Stable.  CONSULTATIONS:  Jonathon Bellows, MD  PROCEDURES:  None.  LABORATORY DATA:  CBC on admission significant for a hemoglobin of 17, otherwise unremarkable.  Potassium on admission was 2.8.  Otherwise normal CMET.  Lipase on admission normal, on October 17, 2010 was 136. TSH was 2.156.  C-reactive protein 7.9.  Urinalysis showed moderate bilirubin,  greater than 80 ketones, trace blood, 100 protein.  Stool culture preliminarily is negative except for reduced normal flora. Fecal lactoferrin positive.  Ova and parasites negative.  C-diff PCR negative.  DIAGNOSTICS:  CT of the abdomen and pelvis showed mild diffuse wall thickening and stranding along the distal ileum with slight sparing of the terminal ileum, may reflect an infectious or inflammatory process. Additional focal segmental wall thickening at the hepatic flexure of the colon with minimal associated free fluid.  Mild dysmotility in relation to the segment.  HISTORY AND HOSPITAL COURSE:  Please see H and P for details.  [name] is a pleasant 35 year old white female who presented multiple times to the emergency room with severe abdominal pain and diarrhea. She had a CAT scan which showed enterocolitis.  She had normal vital signs.  She had dry mucous membranes.  A soft, nondistended, diffusely tender abdomen.  She was admitted and started on empiric antibiotics. GI was consulted.  She has a history of acid reflux and proton pump inhibitor was continued.  She also was placed on Carafate by GI.  She was started on p.r.n. Levsin.  At the time of discharge, she was tolerating a solid diet and feeling much better.  GI will do outpatient endoscopy but I feel that this was most likely an infectious enterocolitis.  Total time on the day of discharge is greater than 30 minutes.     Kaspar Albornoz L. Lendell Caprice, MD     CLS/MEDQ  D:  10/19/2010  T:  10/19/2010  Job:  [id number]  cc:   [name], M.D.  Electronically Signed by Crista Curb MD on 11/07/2010 08:05:25 AM

## 2010-11-09 ENCOUNTER — Other Ambulatory Visit: Payer: Self-pay | Admitting: Internal Medicine

## 2010-11-09 DIAGNOSIS — R197 Diarrhea, unspecified: Secondary | ICD-10-CM

## 2010-11-09 MED ORDER — PEG 3350-KCL-NA BICARB-NACL 420 G PO SOLR: ORAL | Status: AC

## 2010-11-11 ENCOUNTER — Telehealth: Payer: Self-pay

## 2010-11-11 NOTE — Telephone Encounter (Signed)
Got approval for pts Dexilant. Called and informed pt, she asked [facility]. She had [place] send it to the [facility] in [place] because she was at work that day and it was closer for her. [facility] and faxed PA approval form to them.

## 2010-11-14 ENCOUNTER — Encounter: Payer: Medicaid Other | Admitting: Internal Medicine

## 2010-11-14 ENCOUNTER — Other Ambulatory Visit: Payer: Self-pay | Admitting: Internal Medicine

## 2010-11-14 ENCOUNTER — Ambulatory Visit (HOSPITAL_COMMUNITY)
Admission: RE | Admit: 2010-11-14 | Discharge: 2010-11-14 | Disposition: A | Discharge status: Home / Self Care | Payer: Medicaid Other | Source: Ambulatory Visit | Attending: Internal Medicine | Admitting: Internal Medicine

## 2010-11-14 DIAGNOSIS — K449 Diaphragmatic hernia without obstruction or gangrene: Secondary | ICD-10-CM | POA: Insufficient documentation

## 2010-11-14 DIAGNOSIS — K5289 Other specified noninfective gastroenteritis and colitis: Secondary | ICD-10-CM | POA: Insufficient documentation

## 2010-11-14 DIAGNOSIS — K2100 Gastro-esophageal reflux disease with esophagitis, without bleeding: Secondary | ICD-10-CM | POA: Insufficient documentation

## 2010-11-14 DIAGNOSIS — D128 Benign neoplasm of rectum: Secondary | ICD-10-CM

## 2010-11-14 DIAGNOSIS — D129 Benign neoplasm of anus and anal canal: Secondary | ICD-10-CM

## 2010-11-14 HISTORY — PX: ESOPHAGOGASTRODUODENOSCOPY: SHX1529

## 2010-11-14 HISTORY — PX: COLONOSCOPY: SHX174

## 2010-11-14 LAB — PREGNANCY, URINE: Preg Test, Ur: NEGATIVE

## 2010-11-18 NOTE — Op Note (Signed)
NAME:  [name], [name]                           ACCOUNT NO.:  [id number]   MEDICAL RECORD NO.:  [id number]                   PATIENT TYPE:  AMB   LOCATION:  DAY                                  FACILITY:  [facility]   PHYSICIAN:  Dalia Heading, M.D.               DATE OF BIRTH:  07/24/1975   DATE OF PROCEDURE:  03/11/2004  DATE OF DISCHARGE:                                 OPERATIVE REPORT   PREOPERATIVE DIAGNOSIS:  Chronic cholecystitis.   POSTOPERATIVE DIAGNOSIS:  Chronic cholecystitis.   PROCEDURE:  Laparoscopic cholecystectomy.   SURGEON:  Dalia Heading, M.D.   ASSISTANT:  Bernerd Limbo. Leona Carry, M.D.   ANESTHESIA:  General endotracheal.   INDICATIONS FOR PROCEDURE:  The patient is a 35 year old white female who  presents with chronic cholecystitis.  The risks and benefits of the  procedure, including bleeding, infection, hepatobiliary injury, and the  possibility of an open procedure were fully explained to the patient who  gave informed consent.   DESCRIPTION OF PROCEDURE:  The patient was placed in the supine position.  After induction of general endotracheal anesthesia, the abdomen was prepped  and draped using the usual sterile technique with Betadine.  Surgical site  confirmation was performed.   An supraumbilical incision was made down to the fascia.  A Veress needle was  introduced into the abdominal cavity, and confirmation of placement was done  using the saline drop test.  The abdomen was then insufflated to 16 mmHg  pressure.  An 11-mm trocar was introduced into the abdominal cavity under  direct visualization without difficulty.  The patient was placed in reverse  Trendelenburg position, and an additional 11-mm trocar was placed in the  epigastric region and 5-mm trocars were placed in the right upper quadrant  and right flank regions.  The liver was inspected and noted to be within  normal limits.  The gallbladder was retracted superiorly and laterally.  The  dissection was begun around the infundibulum of the gallbladder.  The cystic  duct was first identified.  Its juncture to the infundibulum was fully  identified.  Endoclips were placed proximally and distally on the cystic  duct, and the cystic duct was divided.  This was likewise done on the cystic  artery.  The gallbladder was then freed away from the gallbladder fossa  using Bovie electrocautery.  The gallbladder was delivered through the  epigastric trocar site using an EndoCatch bag.  The gallbladder fossa was  inspected, and no abnormal bleeding or bile leakage was noted.  Surgicel was  placed in the gallbladder fossa.  All fluid and air were then evacuated from  the abdominal cavity prior to removal of the trocars.   All wounds were irrigated with normal saline.  All wounds were injected with  0.5% Sensorcaine.  The supraumbilical fascia was reapproximated using an 0  Vicryl interrupted suture.  All skin incisions were  closed using staples.  Betadine ointment and dry sterile dressings were applied.   All tape and needle counts were correct at the end of the procedure.  The  patient was extubated in the operating room and went back to the recovery  room awake and in stable condition.   COMPLICATIONS:  None.   SPECIMENS:  Gallbladder.   ESTIMATED BLOOD LOSS:  Minimal.      ___________________________________________                                            Dalia Heading, M.D.   MAJ/MEDQ  D:  03/11/2004  T:  03/11/2004  Job:  [id number]   cc:   Kirk Ruths, M.D.  [address]  [place]  [place] [address]  Fax: [phone]   R. Roetta Sessions, M.D.  [address]  [place]  [place] [address]  Fax: [phone]

## 2010-11-18 NOTE — Discharge Summary (Signed)
[facility]  Patient:    [name], [name]                        MRN: [id number] Adm. Date:  [id number] Disc. Date: 12/30/99 Attending:  Malon Kindle                           Discharge Summary  HISTORY OF PRESENT ILLNESS:   This is a 35 year old, white, married female, para 1-0-1-1, gravida 3.  Last period March 25, 1999.  [facility] December 30, 1999, by dates and January 04, 2000, by ultrasound.  Admitted in early labor.  Blood group and type was A+ with a negative antibiotic.  Nonreactive serology. Rubella positive.  Hepatitis B surface antigen negative.  HIV negative.  GC and chlamydia negative.  Triple screen normal.  One-hour glucola 115.  Group B streptococcus positive.  Vaginal ultrasound on June 01, 1999, with crown/rump length 2.2 cm, 9 weeks 0 days, [facility] January 04, 2000.  The patient was advised to discontinue smoking.  Repeat ultrasound with average gestational age [redacted] weeks 1 day, [facility] December 28, 1999.  She was treated with Valtrex for five days on August 17, 1999.  She was treated with Zithromax and dicloxacillin on September 16, 1999, for bronchitis.  The patients fundal height lagged.  An ultrasound on October 25, 1999, showed an estimated fetal weight of 1754 g, which was approximately the 50th percentile.  She was advised to quit smoking, but continued.  On December 21, 1999, the cervix was 1 cm, 50%, vertex, and -2. On the examination on the day of admission, the cervix was 2+ cm in the office, 3 cm in MAO, and by the time she was admitted was 3-4 cm.  ALLERGIES:                    No known drug allergies.  PAST MEDICAL HISTORY:         Illnesses:  Herpes simplex virus.  PAST SURGICAL HISTORY:        Laser conization for severe dysplasia in 1992.  SOCIAL HISTORY:               Alcohol:  None.  Tobacco:  One half to three quarters pack per day.  FAMILY HISTORY:               Mother with high blood pressure.  PHYSICAL EXAMINATION:          Normal vital signs.  ABDOMEN:                      Soft.  Fundal height 35 cm.  Fetal heart tones normal.  Reactive nonstress test.  PELVIC:                       Cervix 3-4 cm, 70%, vertex -2.  Artificial rupture of membranes with clear fluid.  IMPRESSION:                   1. Intrauterine pregnancy at 39 weeks.                               2. Possible small for gestational age infant.  3. Early labor.                               4. Positive group B streptococcus.  HOSPITAL COURSE:              The patient was placed on intravenous penicillin.  She requested an epidural and she received it.  At 11:20 p.m., she was on 4 mu/min of Pitocin.  I was called to see her because of a 10-minute window of decelerations of the fetal heart rate.  These occurred immediately following an exam with the patient on her back.  The fetal heart rate recovered after the 10-minute window and was subsequently normal with good scalp stimulation response.  The cervix was 4+ cm, 80-90% vertex, and -1. The patient progressed to full dilatation and delivered spontaneously LOA over an intact perineum by Malachi Pro. Ambrose Mantle, M.D., a living female infant, 6 pounds 14 ounces, Apgars of 9 at one minute and 9 at five minutes.  The placenta was intact.  Uterus normal.  No lacerations.  Blood loss about 300 cc. Postpartum, the patient did quite well.  She had delivered early in the morning on December 29, 1999, and was ready for discharge on December 30, 1999.  The initial hemoglobin was 14.4, hematocrit 40.5, white count 13,300, and platelet count 191,000.  Follow-up hemoglobin 13.1, hematocrit 36.7, white count 12,800, and platelet count 144,000.  The RPR was nonreactive.  FINAL DIAGNOSIS:              Intrauterine pregnancy at 39 weeks, delivered low occipitoanterior.  OPERATION:                    Spontaneous delivery, low occipitoanterior.  CONDITION ON DISCHARGE:       Improved.  DISCHARGE  INSTRUCTIONS:       Instructions include our regular discharge instruction booklet.  DISCHARGE MEDICATIONS:        She was given a prescription for ibuprofen 80 mg, 20 tablets, one every eight hours as needed for pain.  FOLLOW-UP:                    Asked to return in six weeks for follow-up examination. DD:  12/30/99 TD:  12/30/99 Job: [id number]

## 2010-11-18 NOTE — Op Note (Signed)
NAME:  [name], [name]                           ACCOUNT NO.:  [id number]   MEDICAL RECORD NO.:  [id number]                   PATIENT TYPE:  AMB   LOCATION:  DAY                                  FACILITY:  [facility]   PHYSICIAN:  R. Roetta Sessions, M.D.              DATE OF BIRTH:  1975-11-11   DATE OF PROCEDURE:  DATE OF DISCHARGE:                                 OPERATIVE REPORT   PROCEDURE:  Diagnostic esophagogastroduodenoscopy.   ENDOSCOPIST:  Gerrit Friends. Rourk, M.D.   INDICATIONS FOR PROCEDURE:  The patient is a 35 year old lady with  intermittent epigastric pain associated with nausea. She has reportedly a  small gallbladder polyp on ultrasound, otherwise the gallbladder is  negative.  HIDA scan demonstrated ejection fraction of 38.4% with some  reproduction in her symptoms with half-and-half.  She has gotten some relief  with taking NuLev.  She is also on Protonix 40 mg orally daily. EGD is now  being done to further evaluate her symptoms.  This approach has been  discussed with the patient at length.  The potential risks, benefits, and alternatives have been reviewed;  questions answered.  Please see the dictated H&P for more information.   PROCEDURE NOTE:  O2 saturation, blood pressure, pulse and respirations were  monitored throughout the entire procedure.  Conscious sedation: Versed 3 mg  IV, Demerol 75 mg IV in divided doses   INSTRUMENT:  Olympus video chip adult gastroscope.   FINDINGS:  Examination of the tubular esophagus revealed no mucosal  abnormalities.  There was a somewhat accentuated, undulating Z-line.  The EG  junction was easily traversed.   STOMACH:  The gastric cavity was empty.  It insufflated well with air.  A  thorough examination of the gastric mucosa including a retroflex view of the  proximal stomach and esophagogastric junction demonstrated no abnormalities.  The pylorus was patent and easily traversed.   DUODENUM:  The bulb and the second portion  appeared normal.   THERAPEUTIC/DIAGNOSTIC MANEUVERS:  None.   The patient tolerated the procedure well and was reacted in endoscopy.   IMPRESSION:  1. Normal esophagus, stomach, D-1, D2.  2. I suspect that some of the patient's symptoms are emanating from her     gallbladder.  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  She has     realized some improvement with NuLev.   RECOMMENDATIONS:  1. Continue NuLev 1 on the tongue a.c. p.r.n.  2. Continue Protonix 40 mg orally daily.  3. Will see her back in 6 weeks.  4. If her symptoms become more of a problem would consider surgical     consultation for contemplation of cholecystectomy, but that is not an     emergent issue at this time.      ___________________________________________  Jonathon Bellows, M.D.   RMR/MEDQ  D:  06/10/2003  T:  06/10/2003  Job:  [id number]   cc:   Kirk Ruths, M.D.  [address]  [place]  [place] [address]  Fax: [phone]

## 2010-11-18 NOTE — Consult Note (Signed)
NAMEMICHELLE, WNEK                             ACCOUNT NO.:  [id number]   MEDICAL RECORD NO.:  [id number]                  PATIENT TYPE:   LOCATION:                                       FACILITY:   PHYSICIAN:  R. Roetta Sessions, M.D.              DATE OF BIRTH:   DATE OF CONSULTATION:  06/03/2003  DATE OF DISCHARGE:                                   CONSULTATION   PRIMARY CARE PHYSICIAN:  Kirk Ruths, M.D.   CHIEF COMPLAINT:  Epigastric pain.   HISTORY OF PRESENT ILLNESS:  [name] is a 35 year old Caucasian female patient  of [employer] who presents today for further evaluation of  chronic epigastric pain associated with nausea.  We saw her back in December  2003 with the same complaint.  At that time she had a repeat abdominal  ultrasound to assess gallbladder polyp and look for any other changes.  She  had normal LFTs at that time as well.  Ultrasound did reveal small polyp  within the gallbladder which was unchanged from April 2002.  Our plans were  for a HIDA scan as the next step.  However, she was a no-show for her next  appointment.  She tells me essentially her symptoms have only progressed.  She is having frequent episodes of epigastric pain which she describes as  knife-like.  It is associated with nausea.  She never vomits.  She also has  nausea related to smells.  She continues to take Protonix, NuLev, and  Mylanta.  Mylanta and NuLev seem to help some.  She is watching her diet  closely.  She basically consumes no caffeine.  Often her symptoms begin  around 6 p.m. when she is cooking.  She really denies any kind of  postprandial component.  She has not lost any weight although tells me she  has a very poor appetite.  According to our scales she is actually down 6  pounds.  She recently had abdominal ultrasound which was unremarkable.  She  also had a HIDA scan which was normal and reported to have a normal ejection  fraction of 38.4%.  She consumed  half-and-half and tells me that she did  have some reproduction of her symptoms.  I need to check with radiology  regarding normal EF values as I believe it used to be 50% and above.   CURRENT MEDICATIONS:  1. Protonix 40 mg daily.  2. Ibuprofen 200 mg p.r.n.  3. Mylanta p.r.n.  4. NuLev p.r.n.  5. Ortho Tri-Cyclen one daily.  6. Rolaids p.r.n.   ALLERGIES:  No known drug allergies.   PAST MEDICAL HISTORY:  Anxiety, seasonal allergies.   PAST SURGICAL HISTORY:  She had laser treatment of precancerous cervical  cells at age 16.   FAMILY HISTORY:  Mother has hiatal hernia and had her gallbladder removed.  She has multiple stomach problems.  No family  history of colorectal cancer  to her knowledge.  No family history of Crohn's disease either.   SOCIAL HISTORY:  She has been married for five years and has two children.  She is a housewife.  She smokes one pack of cigarettes daily.  She has  smoked over 10 years.  Denies any alcohol use.   REVIEW OF SYSTEMS:  Please see HPI for GI.  GENERAL:  She denies any weight  loss.  CARDIOPULMONARY:  Denies any chest pain or shortness of breath.   PHYSICAL EXAMINATION:  VITAL SIGNS:  Weight 110 - down from 116.  Blood  pressure 110/68, pulse 72.  GENERAL:  Pleasant, well-nourished, well-developed Caucasian female in no  acute distress.  SKIN:  Warm and dry, no jaundice.  HEENT:  Pupils equal, round, and reactive to light.  Conjunctivae are pink,  sclerae nonicteric.  Oropharyngeal mucosa moist and pink.  No lesions,  erythema, or exudate.  CHEST:  Lungs clear to auscultation.  CARDIAC:  Reveals regular rate and rhythm, normal S1, S2.  No murmurs, rubs,  or gallops.  ABDOMEN:  Positive bowel sounds, soft, nondistended.  She has mild  epigastric tenderness to deep palpation.  No organomegaly or masses.  EXTREMITIES:  No edema.   IMPRESSION:  [name] is a 35 year old lady who has history of chronic  intermittent epigastric pain associated  with nausea.  Her symptoms date back  at least for six to seven years but have been worse after the birth of her  last child who is now three.  Symptoms have progressively worsened over the  last one year.  She recently had a HIDA scan which I feel has a borderline  gallbladder ejection fraction.  She also had some symptoms related to  drinking the half-and-half.  However, her symptoms are not typical of  gallbladder disease.  We still may be dealing with a nonulcerative  dyspepsia.  She is on a good regimen for gastroesophageal reflux disease.  I  think at this point we need to go ahead and evaluate her upper GI tract via  upper endoscopy.  If there are no significant findings may consider CT of  the abdomen and pelvis as a next step.   PLAN:  1. EGD in the near future.  2. Protonix samples #30 take one p.o. daily.  3. Refilled her NuLev #90 one p.o. t.i.d. p.r.n. with one refill.  4. If she has any problems in the interim she will let us know.  5. Further recommendations to follow.   I would like to thank Dr. Regino Schultze for allowing [place] to take part in the care  of this patient.     ________________________________________  ___________________________________________  Tana Coast, Pricilla Larsson, M.D.   LL/MEDQ  D:  06/03/2003  T:  06/03/2003  Job:  [id number]   cc:   Kirk Ruths, M.D.  [address]  [place]  [place] [address]  Fax: [phone]

## 2010-11-18 NOTE — H&P (Signed)
NAME:  [name], [place] NO.:  [id number]   MEDICAL RECORD NO.:  [id number]                   PATIENT TYPE:   LOCATION:                                       FACILITY:   PHYSICIAN:  Dalia Heading, M.D.               DATE OF BIRTH:  03-27-1976   DATE OF ADMISSION:  DATE OF DISCHARGE:                                HISTORY & PHYSICAL   CHIEF COMPLAINT:  Chronic cholecystitis.   HISTORY OF PRESENT ILLNESS:  The patient is a 35 year old white female who  was referred for evaluation and treatment of biliary colic secondary to  chronic cholecystitis.  She has been having intermittent episodes of right  upper quadrant abdominal pain with radiation to the right flank, nausea,  bloating for many months.  She does have fatty food intolerance.  No fever,  chills, or jaundice have been noted.   PAST MEDICAL HISTORY:  As noted above.   PAST SURGICAL HISTORY:  Cervical cancer laser surgery.   CURRENT MEDICATIONS:  Protonix, Carafate.   ALLERGIES:  No known drug allergies.   REVIEW OF SYSTEMS:  The patient smokes a pack of cigarettes a day.  She  denies any significant alcohol use.  She denies any other cardiopulmonary  difficulties or bleeding disorders.   PHYSICAL EXAMINATION:  GENERAL:  The patient is a well-developed, well-  nourished, white female in no acute distress.  VITAL SIGNS:  She is afebrile and vital signs are stable.  HEENT:  Reveals no scleral icterus.  LUNGS:  Clear to auscultation with equal breath sounds bilaterally.  HEART:  Reveals a regular rate and rhythm without S3, S4, or murmurs.  ABDOMEN:  Soft with slight tenderness noted in the right upper quadrant to  palpation.  No hepatosplenomegaly, masses, or hernias are identified.   Ultrasound of the gallbladder is negative.  Hepatobiliary scan reveals a  decreased gallbladder ejection fraction with reproducible symptoms with a  fatty meal.   IMPRESSION:  Chronic cholecystitis.   PLAN:  The patient is scheduled for a laparoscopic cholecystectomy on  March 11, 2004.  The risks and benefits of the procedure including  bleeding, infection, hepatobiliary injury, and the possibility of an open  procedure were fully explained to the patient, gave informed consent.     ___________________________________________                                         Dalia Heading, M.D.   MAJ/MEDQ  D:  03/01/2004  T:  03/01/2004  Job:  [id number]   cc:   chart   Kirk Ruths, M.D.  [address]  [place]  [place] [address]  Fax: [phone]   R. Roetta Sessions, M.D.  [address]    [place] [address]  Fax: [phone]

## 2010-11-21 NOTE — Op Note (Signed)
[name], [name]                 ACCOUNT NO.:  [id number]  MEDICAL RECORD NO.:  [id number]           PATIENT TYPE:  O  LOCATION:  DAYP                          FACILITY:  [facility]  PHYSICIAN:  R. Roetta Sessions, M.D. DATE OF BIRTH:  06/08/1976  DATE OF PROCEDURE:  11/14/2010 DATE OF DISCHARGE:                              OPERATIVE REPORT   PROCEDURE:  EGD followed by ileal colonoscopy with biopsy, snare polypectomy, polyp ablation.  INDICATIONS FOR PROCEDURE:  A 35 year old lady with recent hospitalization for ileocolitis.  She has improved dramatically.  She has had longstanding gastroesophageal reflux.  For these symptoms, now on Dexilant for a couple of weeks, she states Dexilant is working very well for this time.  No dysphagia, no odynophagia, no melena, or rectal bleeding at this time.  Bowel function is normalized on probiotic therapy.  EGD and colonoscopy now being done.  Risks, benefits, limitations, alternatives, imponderables have been discussed, questions answered.  Please see the documentation in the medical record.  PROCEDURE NOTE:  O2 saturation, blood pressure, pulse, respirations were monitored throughout the entirety of both procedures.  CONSCIOUS SEDATION:  Versed 8 mg IV, Demerol 125 mg IV in divided doses.  INSTRUMENT:  Pentax video chip system.  FINDINGS:  EGD.  Examination of tubular esophagus revealed tiny distal esophageal erosion straddling the GE junction.  There is no Barrett's esophagus or other abnormality.  EG junction easily traversed.  Stomach:  Gastric cavity was emptied and insufflated well with air. Thorough examination of gastric mucosa including retroflexion of proximal stomach, esophagogastric junction demonstrated only a small hiatal hernia.  Pylorus was patent, easily traversed.  Examination of the bulb and second portion revealed no abnormalities.  THERAPEUTIC/DIAGNOSTIC MANEUVERS PERFORMED:  None.  The patient tolerated the  procedure well, was prepared for colonoscopy. Digital rectal exam revealed no abnormalities.  ENDOSCOPIC FINDINGS:  Prep was adequate.  Colon:  Colonic mucosa was surveyed from the rectosigmoid junction through the left transverse right colon to the appendiceal orifice, ileocecal valve/cecum.  These structures were well seen and photographed for the record.  Terminal ileum was intubated to 10 cm.  From this level, scope was withdrawn. All previously mentioned mucosal surfaces were again seen.  The patient had 1-2 mm ileal erosions handful scattered throughout the very distal terminal ileum, more proximally mucosa really looked good.  Biopsies of these abnormal areas were taken for histologic study.  From the ileocecal valve, scope was slowly and cautiously withdrawn.  All previously mentioned colonic mucosal surfaces were again seen.  The colonic mucosa appeared normal.  Scope was pulled down into the rectum where a thorough examination of rectal mucosa including retroflexed view of the anal verge demonstrated a clustering of polyps at 8 cm and they appeared hyperplastic.  Three were snared and three were ablated with the tip hot snare cautery unit.  Remainder of rectal mucosa appeared normal.  The patient tolerated the procedure well.  Cecal withdrawal time 14 minutes.  IMPRESSION: 1. EGD, tiny distal esophageal erosions consistent with mild erosive     reflux esophagitis. 2. Small hiatal hernia, otherwise normal stomach D1  and D2.  COLONOSCOPY FINDINGS: 1. Hyperplastic-appearing polyps ablated and/or snared from the     rectum, otherwise normal-appearing rectum and colon.  Erosions in     the terminal ileum status post biopsy. 2. I suspect colonoscopy findings regarding the ileum or more     consistent with resolving infectious enterocolitis rather than     occult inflammatory bowel disease.  Clinically, she is doing well     at this time.  RECOMMENDATIONS: 1. Continued  Dexilant 60 mg orally daily. 2. GERD and hiatal hernia literature provided to Ms. Brinkman. 3. Continue probiotic in the way of Florastor for now. 4. Follow up on path. 5. Further recommendations to follow. 6. Literature on polyps provided as well.     Jonathon Bellows, M.D.     RMR/MEDQ  D:  11/14/2010  T:  11/15/2010  Job:  [id number]  cc:   Dr. Clenton Pare, [facility]  Electronically Signed by Lorrin Goodell M.D. on 11/21/2010 10:04:32 AM

## 2010-11-22 ENCOUNTER — Encounter: Payer: Self-pay | Admitting: Internal Medicine

## 2011-01-25 ENCOUNTER — Telehealth: Payer: Self-pay

## 2011-01-25 NOTE — Telephone Encounter (Signed)
Pt called- left voicemail- she has started having some increased reflux issues. She is taking the dexilant daily but wants to know if there is something she can take to "supplement" with the dexilant. She is having some nausea and abd pain. Please advise.

## 2011-01-26 NOTE — Telephone Encounter (Signed)
Make sure she is following reflux diet. Avoid the trigger foods. Stay away from caffeine, fried foods, etc. She was doing well on Dexilant at time of EGD. Probably needs OV to assess how she is doing after these procedures. Continue probiotic, take Levsin prn abd cramps.

## 2011-01-30 ENCOUNTER — Ambulatory Visit: Payer: Medicaid Other | Admitting: Gastroenterology

## 2011-01-30 ENCOUNTER — Telehealth: Payer: Self-pay | Admitting: Gastroenterology

## 2011-01-30 NOTE — Telephone Encounter (Signed)
Tried to call pt- NA 

## 2011-01-30 NOTE — Telephone Encounter (Signed)
Pt had ov for today and was a no show.

## 2011-02-01 NOTE — Telephone Encounter (Signed)
Routed to provider

## 2011-04-05 LAB — COMPREHENSIVE METABOLIC PANEL
AST: 20
CO2: 21
Calcium: 9.4
Creatinine, Ser: 0.81
GFR calc Af Amer: >60
GFR calc non Af Amer: >60

## 2011-04-05 LAB — CBC
MCHC: 34.9
MCV: 91.7
RBC: 4.69

## 2011-04-05 LAB — URINALYSIS, ROUTINE W REFLEX MICROSCOPIC
Bilirubin Urine: NEGATIVE
Hgb urine dipstick: NEGATIVE
Ketones, ur: 15 — AB
Protein, ur: NEGATIVE
Specific Gravity, Urine: 1.02
Urobilinogen, UA: 0.2

## 2011-04-05 LAB — LIPASE, BLOOD: Lipase: 19

## 2011-04-05 LAB — DIFFERENTIAL
Lymphocytes Relative: 16
Lymphs Abs: 1.2
Neutrophils Relative %: 81 — ABNORMAL HIGH

## 2011-05-10 ENCOUNTER — Other Ambulatory Visit: Payer: Self-pay | Admitting: Gastroenterology

## 2011-05-10 MED ORDER — DEXLANSOPRAZOLE 60 MG PO CPDR: 60.0000 mg | DELAYED_RELEASE_CAPSULE | Freq: Every day | ORAL | Status: DC

## 2012-05-19 ENCOUNTER — Other Ambulatory Visit: Payer: Self-pay | Admitting: Gastroenterology

## 2013-02-13 ENCOUNTER — Encounter (HOSPITAL_COMMUNITY): Payer: Self-pay | Admitting: Emergency Medicine

## 2013-02-13 ENCOUNTER — Emergency Department (HOSPITAL_COMMUNITY): Payer: Medicaid Other

## 2013-02-13 ENCOUNTER — Emergency Department (HOSPITAL_COMMUNITY)
Admission: EM | Admit: 2013-02-13 | Discharge: 2013-02-13 | Disposition: A | Discharge status: Home / Self Care | Payer: Medicaid Other | Attending: Emergency Medicine | Admitting: Emergency Medicine

## 2013-02-13 DIAGNOSIS — L03113 Cellulitis of right upper limb: Secondary | ICD-10-CM

## 2013-02-13 DIAGNOSIS — F17200 Nicotine dependence, unspecified, uncomplicated: Secondary | ICD-10-CM | POA: Insufficient documentation

## 2013-02-13 DIAGNOSIS — K219 Gastro-esophageal reflux disease without esophagitis: Secondary | ICD-10-CM | POA: Insufficient documentation

## 2013-02-13 DIAGNOSIS — IMO0002 Reserved for concepts with insufficient information to code with codable children: Secondary | ICD-10-CM | POA: Insufficient documentation

## 2013-02-13 DIAGNOSIS — Z8719 Personal history of other diseases of the digestive system: Secondary | ICD-10-CM | POA: Insufficient documentation

## 2013-02-13 DIAGNOSIS — Z8659 Personal history of other mental and behavioral disorders: Secondary | ICD-10-CM | POA: Insufficient documentation

## 2013-02-13 DIAGNOSIS — Z79899 Other long term (current) drug therapy: Secondary | ICD-10-CM | POA: Insufficient documentation

## 2013-02-13 DIAGNOSIS — Z8541 Personal history of malignant neoplasm of cervix uteri: Secondary | ICD-10-CM | POA: Insufficient documentation

## 2013-02-13 DIAGNOSIS — L02413 Cutaneous abscess of right upper limb: Secondary | ICD-10-CM

## 2013-02-13 LAB — CBC WITH DIFFERENTIAL/PLATELET
HCT: 37.4 % (ref 36.0–46.0)
Hemoglobin: 13.1 g/dL (ref 12.0–15.0)
Lymphocytes Relative: 24 % (ref 12–46)
Monocytes Absolute: 0.6 10*3/uL (ref 0.1–1.0)
Monocytes Relative: 6 % (ref 3–12)
Neutro Abs: 7.2 10*3/uL (ref 1.7–7.7)
WBC: 10.5 10*3/uL (ref 4.0–10.5)

## 2013-02-13 LAB — BASIC METABOLIC PANEL
BUN: 10 mg/dL (ref 6–23)
CO2: 24 mEq/L (ref 19–32)
Chloride: 100 mEq/L (ref 96–112)
Creatinine, Ser: 0.59 mg/dL (ref 0.50–1.10)

## 2013-02-13 MED ORDER — ONDANSETRON HCL 4 MG/2ML IJ SOLN
4.0000 mg | Freq: Once | INTRAMUSCULAR | Status: AC
  Administered 2013-02-13: 4 mg via INTRAVENOUS
  Filled 2013-02-13: qty 2

## 2013-02-13 MED ORDER — HYDROMORPHONE HCL PF 1 MG/ML IJ SOLN
1.0000 mg | Freq: Once | INTRAMUSCULAR | Status: AC
  Administered 2013-02-13: 1 mg via INTRAVENOUS
  Filled 2013-02-13: qty 1

## 2013-02-13 MED ORDER — VANCOMYCIN HCL IN DEXTROSE 1-5 GM/200ML-% IV SOLN
1000.0000 mg | Freq: Once | INTRAVENOUS | Status: AC
  Administered 2013-02-13: 1000 mg via INTRAVENOUS
  Filled 2013-02-13: qty 200

## 2013-02-13 MED ORDER — IOHEXOL 300 MG/ML  SOLN
75.0000 mL | Freq: Once | INTRAMUSCULAR | Status: AC | PRN
  Administered 2013-02-13: 75 mL via INTRAVENOUS

## 2013-02-13 MED ORDER — LIDOCAINE HCL (PF) 1 % IJ SOLN
INTRAMUSCULAR | Status: AC
  Filled 2013-02-13: qty 5

## 2013-02-13 MED ORDER — OXYCODONE-ACETAMINOPHEN 5-325 MG PO TABS
ORAL_TABLET | ORAL | Status: AC
  Administered 2013-02-13: 1
  Filled 2013-02-13: qty 1

## 2013-02-13 MED ORDER — SULFAMETHOXAZOLE-TRIMETHOPRIM 800-160 MG PO TABS: 1.0000 | ORAL_TABLET | Freq: Two times a day (BID) | ORAL | Status: DC

## 2013-02-13 MED ORDER — HYDROMORPHONE HCL 2 MG PO TABS: 2.0000 mg | ORAL_TABLET | ORAL | Status: DC | PRN

## 2013-02-13 NOTE — ED Notes (Signed)
Patient states was cleaning garage 5 days ago and thinks she may have been bitten by something.  Patient has abscess to right forearm.  Patient states pain extends from shoulder to fingers.  Swelling and redness noted to right forearm.

## 2013-02-13 NOTE — ED Notes (Signed)
Discharge instructions given and reviewed with patient.  Prescriptions given for Bactrim and Dilaudid; effects and use explained.  Patient verbalized understanding to take antibiotic as directed and sedating effects of Dilaudid.  Patient ambulatory; discharged home in good condition.

## 2013-02-13 NOTE — ED Notes (Signed)
Medications given in 22G IV placed in left anterior forearm.  Unable to flush IV in left wrist.

## 2013-02-13 NOTE — ED Provider Notes (Signed)
CSN: [id number]     Arrival date & time 02/13/13  0126 History     First MD Initiated Contact with Patient 02/13/13 0252     Chief Complaint  Patient presents with  . Abscess   (Consider location/radiation/quality/duration/timing/severity/associated sxs/prior Treatment) Patient is a 37 y.o. female presenting with abscess. The history is provided by the patient.  Abscess She noted a painful swollen area on her right forearm about 5 days ago. She's been treating it with cold compresses but it has been getting bigger and more painful. Pain is severe and she rates it at 10/10. Pain radiates up towards her shoulder down towards her hand. She's not had any fever, chills, sweats. She denies numbness or tingling. She wonders whether she might of been bitten by a spider.  Past Medical History  Diagnosis Date  . GERD (gastroesophageal reflux disease)   . Anxiety   . Cervical ca     [id number]  . Enterocolitis     admission 4/14-4/18   Past Surgical History  Procedure Laterality Date  . Laparoscopic cholecystectomy    . Cervical cone biopsy     Family History  Problem Relation Age of Onset  . GER disease Father   . GER disease Mother   . Colon polyps Mother   . Diverticulosis Mother    History  Substance Use Topics  . Smoking status: Current Every Day Smoker -- 1.00 packs/day for 15 years  . Smokeless tobacco: Not on file  . Alcohol Use: No   OB History   Grav Para Term Preterm Abortions TAB SAB Ect Mult Living                 Review of Systems  All other systems reviewed and are negative.    Allergies  Review of patient's allergies indicates no known allergies.  Home Medications   Current Outpatient Rx  Name  Route  Sig  Dispense  Refill  . calcium carbonate (TUMS - DOSED IN MG ELEMENTAL CALCIUM) 500 MG chewable tablet   Oral   Chew 1 tablet by mouth daily as needed.           Marland Kitchen DEXILANT 60 MG capsule      TAKE 1 CAPSULE (60 MG TOTAL) BY MOUTH DAILY.   30  capsule   11   . Levonorgest-Eth Estrad 91-Day (CAMRESE LO PO)   Oral   Take 1 tablet by mouth daily.           Marland Kitchen saccharomyces boulardii (FLORASTOR) 250 MG capsule   Oral   Take 250 mg by mouth 2 (two) times daily.           . sucralfate (CARAFATE) 1 GM/10ML suspension   Oral   Take 1 g by mouth 4 (four) times daily.            BP 110/65  Pulse 98  Temp(Src) 98.3 F (36.8 C) (Oral)  Resp 20  Ht 5\' 5"  (1.651 m)  Wt 118 lb (53.524 kg)  BMI 19.64 kg/m2  SpO2 100%  LMP 11/13/2012 Physical Exam  Nursing note and vitals reviewed.  37 year old female, who appears to be in pain, but is in no acute distress. Vital signs are normal. Oxygen saturation is 100%, which is normal. Head is normocephalic and atraumatic. PERRLA, EOMI. Oropharynx is clear. Neck is nontender and supple without adenopathy or JVD. Back is nontender and there is no CVA tenderness. Lungs are clear without rales, wheezes, or rhonchi. Chest  is nontender. Heart has regular rate and rhythm without murmur. Abdomen is soft, flat, nontender without masses or hepatosplenomegaly and peristalsis is normoactive. Extremities: There is a large raised, erythematous area in the radial aspect of the right forearm approximately 2/3 the way from the elbow to the wrist. This area is fluctuant and very tender. The remainder of the forearm is mildly swollen and tense compared with the other side. There are no lymphangitic streaks. Remainder of extremity exam is normal. Skin is warm and dry without rash. Neurologic: Mental status is normal, cranial nerves are intact, there are no motor or sensory deficits.  ED Course   Procedures (including critical care time)  INCISION AND DRAINAGE Performed by: ZOXWR,UEAVW Consent: Verbal consent obtained. Risks and benefits: risks, benefits and alternatives were discussed Type: abscess  Body area: Right forearm  Anesthesia: local infiltration  Incision was made with a scalpel.    Limited bedside ultrasound was used to identify the abscess cavity location and its limits of extension. Images were saved.  Local anesthetic: lidocaine 2% without epinephrine  Anesthetic total: 3 ml  Complexity: complex Blunt dissection to break up loculations  Drainage: purulent  Drainage amount: Large   Packing material: None   Patient tolerance: Patient tolerated the procedure well with no immediate complications.   Results for orders placed during the hospital encounter of 02/13/13  CBC WITH DIFFERENTIAL      Result Value Range   WBC 10.5  4.0 - 10.5 K/uL   RBC 4.26  3.87 - 5.11 MIL/uL   Hemoglobin 13.1  12.0 - 15.0 g/dL   HCT 09.8  11.9 - 14.7 %   MCV 87.8  78.0 - 100.0 fL   MCH 30.8  26.0 - 34.0 pg   MCHC 35.0  30.0 - 36.0 g/dL   RDW 82.9  56.2 - 13.0 %   Platelets 216  150 - 400 K/uL   Neutrophils Relative % 69  43 - 77 %   Neutro Abs 7.2  1.7 - 7.7 K/uL   Lymphocytes Relative 24  12 - 46 %   Lymphs Abs 2.6  0.7 - 4.0 K/uL   Monocytes Relative 6  3 - 12 %   Monocytes Absolute 0.6  0.1 - 1.0 K/uL   Eosinophils Relative 1  0 - 5 %   Eosinophils Absolute 0.1  0.0 - 0.7 K/uL   Basophils Relative 0  0 - 1 %   Basophils Absolute 0.0  0.0 - 0.1 K/uL  BASIC METABOLIC PANEL      Result Value Range   Sodium 136  135 - 145 mEq/L   Potassium 3.6  3.5 - 5.1 mEq/L   Chloride 100  96 - 112 mEq/L   CO2 24  19 - 32 mEq/L   Glucose, Bld 110 (*) 70 - 99 mg/dL   BUN 10  6 - 23 mg/dL   Creatinine, Ser 8.65  0.50 - 1.10 mg/dL   Calcium 9.2  8.4 - 78.4 mg/dL   GFR calc non Af Amer >90  >90 mL/min   GFR calc Af Amer >90  >90 mL/min   Ct Forearm Right W Contrast  02/13/2013   *RADIOLOGY REPORT*  Clinical Data: Pain and swelling.  CT OF THE RIGHT FOREARM WITH CONTRAST  Technique:  Multidetector CT imaging was performed following the standard protocol during bolus administration of intravenous contrast.  Contrast: 75mL OMNIPAQUE IOHEXOL 300 MG/ML  SOLN  Comparison: None.   Findings: There is extensive subcutaneous reticulation, most  severe along the distal lateral surface of the forearm where there is a small soft tissue ulcer and ill-defined fluid collection which appears to communicate with the skin surface. This fluid measures 20 x 6 x 6 mm mm.  No well-defined enhancing rim around this collection.  Inflammatory changes extend to contact the brachioradialis and abductor/extensor pollicis musculature.  A vein which traverses the inflammation is occluded or effaced at the level of maximal inflammation. No propagating thrombus seen.  No underlying bony changes.  There is mild intermuscular edema.  No subcutaneous emphysema. No definite foreign body.  There is dependent subcutaneous edema which involves the proximal forearm, extending to the level of the elbow.  IMPRESSION: 1. Forearm cellulitis, maximally along the distal radial surface. There is an ill-defined 20 x 6 x 6 mm fluid collection within the subcutaneous compartment, which appears to drain to the skin surface. No evidence of osteomyelitis.  2. Inflammation closely associated with the lateral posterior compartment tendons.   Original Report Authenticated By: Tiburcio Pea    Images viewed by me.  1. Abscess of right forearm   2. Cellulitis of right forearm     MDM  Abscess of the right forearm. This will need incision and drainage.  Incision and drainage you have a large amount of purulent material but she is still in a great deal of pain. IV will be started and she will be given hydromorphone for pain and she'll be given a dose of vancomycin. CBC will be checked.  WBC is normal the patient continues to have a great deal of pain. She's given a second dose of hydromorphone with better relief but still significant amount of pain. CT shows extensive cellulitis but no evidence of necrotizing fasciitis and no other abscess cavity. I recommended hospitalization for ongoing IV vancomycin and ongoing intravenous  narcotics as needed for pain control. Patient stated she much preferred going home. She felt that if she could get hydromorphone for pain at home that she would be okay. In light of normal WBC and the patient's overall nontoxic appearance and CT scan not showing evidence of necrotizing fasciitis, it was felt reasonable to give her a trial at home. She is discharged with a prescription for trimethoprim-sulfamethoxazole and hydromorphone she is to return to the ED in 24 hours for a recheck.  Dione Booze, MD 02/13/13 [phone]

## 2013-02-13 NOTE — ED Notes (Signed)
Patient requesting drink; informed patient we are waiting on results of CT.  Patient informed she may be transferred to [facility] for hand specialist and OR.  Patient verbalized understanding.

## 2013-02-15 ENCOUNTER — Encounter (HOSPITAL_COMMUNITY): Payer: Self-pay

## 2013-02-15 ENCOUNTER — Emergency Department (HOSPITAL_COMMUNITY)
Admission: EM | Admit: 2013-02-15 | Discharge: 2013-02-15 | Disposition: A | Discharge status: Home / Self Care | Payer: Medicaid Other | Attending: Emergency Medicine | Admitting: Emergency Medicine

## 2013-02-15 DIAGNOSIS — F17200 Nicotine dependence, unspecified, uncomplicated: Secondary | ICD-10-CM | POA: Insufficient documentation

## 2013-02-15 DIAGNOSIS — L089 Local infection of the skin and subcutaneous tissue, unspecified: Secondary | ICD-10-CM | POA: Insufficient documentation

## 2013-02-15 DIAGNOSIS — Z8659 Personal history of other mental and behavioral disorders: Secondary | ICD-10-CM | POA: Insufficient documentation

## 2013-02-15 DIAGNOSIS — K219 Gastro-esophageal reflux disease without esophagitis: Secondary | ICD-10-CM | POA: Insufficient documentation

## 2013-02-15 DIAGNOSIS — Z8541 Personal history of malignant neoplasm of cervix uteri: Secondary | ICD-10-CM | POA: Insufficient documentation

## 2013-02-15 DIAGNOSIS — Z8719 Personal history of other diseases of the digestive system: Secondary | ICD-10-CM | POA: Insufficient documentation

## 2013-02-15 DIAGNOSIS — Z79899 Other long term (current) drug therapy: Secondary | ICD-10-CM | POA: Insufficient documentation

## 2013-02-15 LAB — CBC WITH DIFFERENTIAL/PLATELET
Basophils Absolute: 0 10*3/uL (ref 0.0–0.1)
Basophils Relative: 0 % (ref 0–1)
Eosinophils Absolute: 0.1 10*3/uL (ref 0.0–0.7)
Eosinophils Relative: 1 % (ref 0–5)
HCT: 39.6 % (ref 36.0–46.0)
Hemoglobin: 13.8 g/dL (ref 12.0–15.0)
Lymphocytes Relative: 34 % (ref 12–46)
Lymphs Abs: 2.7 10*3/uL (ref 0.7–4.0)
MCH: 30.7 pg (ref 26.0–34.0)
MCHC: 34.8 g/dL (ref 30.0–36.0)
MCV: 88.2 fL (ref 78.0–100.0)
Monocytes Absolute: 0.3 10*3/uL (ref 0.1–1.0)
Monocytes Relative: 4 % (ref 3–12)
Neutro Abs: 4.9 10*3/uL (ref 1.7–7.7)
Neutrophils Relative %: 61 % (ref 43–77)
Platelets: 301 10*3/uL (ref 150–400)
RBC: 4.49 MIL/uL (ref 3.87–5.11)
RDW: 14.3 % (ref 11.5–15.5)
WBC: 8 10*3/uL (ref 4.0–10.5)

## 2013-02-15 LAB — BASIC METABOLIC PANEL
BUN: 9 mg/dL (ref 6–23)
CO2: 27 mEq/L (ref 19–32)
Calcium: 10 mg/dL (ref 8.4–10.5)
Creatinine, Ser: 0.75 mg/dL (ref 0.50–1.10)

## 2013-02-15 MED ORDER — LORAZEPAM 1 MG PO TABS: ORAL_TABLET | ORAL | Status: DC

## 2013-02-15 MED ORDER — HYDROMORPHONE HCL 4 MG PO TABS: 4.0000 mg | ORAL_TABLET | ORAL | Status: DC | PRN

## 2013-02-15 MED ORDER — ONDANSETRON HCL 4 MG/2ML IJ SOLN
4.0000 mg | Freq: Once | INTRAMUSCULAR | Status: AC
  Administered 2013-02-15: 4 mg via INTRAVENOUS
  Filled 2013-02-15: qty 2

## 2013-02-15 MED ORDER — HYDROMORPHONE HCL PF 1 MG/ML IJ SOLN
1.0000 mg | Freq: Once | INTRAMUSCULAR | Status: AC
  Administered 2013-02-15: 1 mg via INTRAVENOUS
  Filled 2013-02-15: qty 1

## 2013-02-15 MED ORDER — HYDROMORPHONE HCL PF 1 MG/ML IJ SOLN
0.5000 mg | Freq: Once | INTRAMUSCULAR | Status: AC
  Administered 2013-02-15: 0.5 mg via INTRAVENOUS
  Filled 2013-02-15: qty 1

## 2013-02-15 MED ORDER — LORAZEPAM 2 MG/ML IJ SOLN
0.5000 mg | Freq: Once | INTRAMUSCULAR | Status: AC
  Administered 2013-02-15: 0.5 mg via INTRAVENOUS
  Filled 2013-02-15: qty 1

## 2013-02-15 MED ORDER — VANCOMYCIN HCL IN DEXTROSE 1-5 GM/200ML-% IV SOLN
1000.0000 mg | Freq: Once | INTRAVENOUS | Status: AC
  Administered 2013-02-15: 1000 mg via INTRAVENOUS
  Filled 2013-02-15: qty 200

## 2013-02-15 NOTE — ED Notes (Signed)
Abscess to right arm with cellulitis. They wanted to admit me but I wanted to go home. They told me to come by for a follow up today. Having a lot of pain.

## 2013-02-15 NOTE — ED Provider Notes (Signed)
CSN: [id number]     Arrival date & time 02/15/13  0029 History     First MD Initiated Contact with Patient 02/15/13 0146     Chief Complaint  Patient presents with  . Follow-up  . Wound Check   (Consider location/radiation/quality/duration/timing/severity/associated sxs/prior Treatment) Patient is a 37 y.o. female presenting with wound check. The history is provided by the patient (pt had an abscess opened 2 days ago with arm cellulitus.  pt here for recheck.  she states the swelling is much better).  Wound Check This is a recurrent problem. The current episode started 2 days ago. The problem occurs constantly. The problem has been gradually improving. Pertinent negatives include no chest pain, no abdominal pain and no headaches. Nothing aggravates the symptoms. Nothing relieves the symptoms.    Past Medical History  Diagnosis Date  . GERD (gastroesophageal reflux disease)   . Anxiety   . Cervical ca     [id number]  . Enterocolitis     admission 4/14-4/18   Past Surgical History  Procedure Laterality Date  . Laparoscopic cholecystectomy    . Cervical cone biopsy     Family History  Problem Relation Age of Onset  . GER disease Father   . GER disease Mother   . Colon polyps Mother   . Diverticulosis Mother    History  Substance Use Topics  . Smoking status: Current Every Day Smoker -- 1.00 packs/day for 15 years  . Smokeless tobacco: Not on file  . Alcohol Use: No   OB History   Grav Para Term Preterm Abortions TAB SAB Ect Mult Living                 Review of Systems  Constitutional: Negative for appetite change and fatigue.  HENT: Negative for congestion, sinus pressure and ear discharge.   Eyes: Negative for discharge.  Respiratory: Negative for cough.   Cardiovascular: Negative for chest pain.  Gastrointestinal: Negative for abdominal pain and diarrhea.  Genitourinary: Negative for frequency and hematuria.  Musculoskeletal: Negative for back pain.  Skin:  Positive for wound. Negative for rash.       Swelling pain left arm  Neurological: Negative for seizures and headaches.  Psychiatric/Behavioral: Negative for hallucinations.    Allergies  Review of patient's allergies indicates no known allergies.  Home Medications   Current Outpatient Rx  Name  Route  Sig  Dispense  Refill  . calcium carbonate (TUMS - DOSED IN MG ELEMENTAL CALCIUM) 500 MG chewable tablet   Oral   Chew 1 tablet by mouth daily as needed.           Marland Kitchen DEXILANT 60 MG capsule      TAKE 1 CAPSULE (60 MG TOTAL) BY MOUTH DAILY.   30 capsule   11   . HYDROmorphone (DILAUDID) 2 MG tablet   Oral   Take 1-2 tablets (2-4 mg total) by mouth every 4 (four) hours as needed for pain.   20 tablet   0   . Levonorgest-Eth Estrad 91-Day (CAMRESE LO PO)   Oral   Take 1 tablet by mouth daily.           Marland Kitchen saccharomyces boulardii (FLORASTOR) 250 MG capsule   Oral   Take 250 mg by mouth 2 (two) times daily.           . sucralfate (CARAFATE) 1 GM/10ML suspension   Oral   Take 1 g by mouth 4 (four) times daily.           Marland Kitchen  sulfamethoxazole-trimethoprim (BACTRIM DS,SEPTRA DS) 800-160 MG per tablet   Oral   Take 1 tablet by mouth 2 (two) times daily. One po bid x 3 days   20 tablet   0   . HYDROmorphone (DILAUDID) 4 MG tablet   Oral   Take 1 tablet (4 mg total) by mouth every 4 (four) hours as needed for pain.   20 tablet   0    BP 114/62  Pulse 78  Temp(Src) 97.5 F (36.4 C) (Oral)  Resp 20  Ht 5\' 5"  (1.651 m)  Wt 118 lb (53.524 kg)  BMI 19.64 kg/m2  SpO2 100%  LMP 11/13/2012 Physical Exam  Constitutional: She is oriented to person, place, and time. She appears well-developed.  HENT:  Head: Normocephalic.  Eyes: Conjunctivae and EOM are normal. No scleral icterus.  Neck: Neck supple. No thyromegaly present.  Cardiovascular: Normal rate and regular rhythm.  Exam reveals no gallop and no friction rub.   No murmur heard. Pulmonary/Chest: No stridor.  She has no wheezes. She has no rales. She exhibits no tenderness.  Abdominal: She exhibits no distension. There is no tenderness. There is no rebound.  Musculoskeletal: Normal range of motion. She exhibits edema.  Pt has a healing abscess to left fore arm.  Some swelling but improving.  Lymphadenopathy:    She has no cervical adenopathy.  Neurological: She is oriented to person, place, and time. Coordination normal.  Skin: No rash noted. No erythema.  Psychiatric: She has a normal mood and affect. Her behavior is normal.    ED Course   Procedures (including critical care time)  Labs Reviewed  BASIC METABOLIC PANEL - Abnormal; Notable for the following:    Glucose, Bld 124 (*)    All other components within normal limits  CBC WITH DIFFERENTIAL   Ct Forearm Right W Contrast  02/13/2013   *RADIOLOGY REPORT*  Clinical Data: Pain and swelling.  CT OF THE RIGHT FOREARM WITH CONTRAST  Technique:  Multidetector CT imaging was performed following the standard protocol during bolus administration of intravenous contrast.  Contrast: 75mL OMNIPAQUE IOHEXOL 300 MG/ML  SOLN  Comparison: None.  Findings: There is extensive subcutaneous reticulation, most severe along the distal lateral surface of the forearm where there is a small soft tissue ulcer and ill-defined fluid collection which appears to communicate with the skin surface. This fluid measures 20 x 6 x 6 mm mm.  No well-defined enhancing rim around this collection.  Inflammatory changes extend to contact the brachioradialis and abductor/extensor pollicis musculature.  A vein which traverses the inflammation is occluded or effaced at the level of maximal inflammation. No propagating thrombus seen.  No underlying bony changes.  There is mild intermuscular edema.  No subcutaneous emphysema. No definite foreign body.  There is dependent subcutaneous edema which involves the proximal forearm, extending to the level of the elbow.  IMPRESSION: 1. Forearm  cellulitis, maximally along the distal radial surface. There is an ill-defined 20 x 6 x 6 mm fluid collection within the subcutaneous compartment, which appears to drain to the skin surface. No evidence of osteomyelitis.  2. Inflammation closely associated with the lateral posterior compartment tendons.   Original Report Authenticated By: Tiburcio Pea   1. Skin infection     MDM  Pt forearm cellulitis and abscess improving will give more vanc and have her follow up in 24 hours  Benny Lennert, MD 02/15/13 [phone]

## 2013-02-15 NOTE — ED Notes (Signed)
Bandage to right forearm removed at this time.

## 2013-02-17 ENCOUNTER — Emergency Department (HOSPITAL_COMMUNITY)
Admission: EM | Admit: 2013-02-17 | Discharge: 2013-02-17 | Disposition: A | Discharge status: Home / Self Care | Payer: Medicaid Other | Attending: Emergency Medicine | Admitting: Emergency Medicine

## 2013-02-17 ENCOUNTER — Encounter (HOSPITAL_COMMUNITY): Payer: Self-pay | Admitting: Emergency Medicine

## 2013-02-17 DIAGNOSIS — F17200 Nicotine dependence, unspecified, uncomplicated: Secondary | ICD-10-CM | POA: Insufficient documentation

## 2013-02-17 DIAGNOSIS — Z79899 Other long term (current) drug therapy: Secondary | ICD-10-CM | POA: Insufficient documentation

## 2013-02-17 DIAGNOSIS — K219 Gastro-esophageal reflux disease without esophagitis: Secondary | ICD-10-CM | POA: Insufficient documentation

## 2013-02-17 DIAGNOSIS — Z8541 Personal history of malignant neoplasm of cervix uteri: Secondary | ICD-10-CM | POA: Insufficient documentation

## 2013-02-17 DIAGNOSIS — Z4800 Encounter for change or removal of nonsurgical wound dressing: Secondary | ICD-10-CM | POA: Insufficient documentation

## 2013-02-17 DIAGNOSIS — Z8719 Personal history of other diseases of the digestive system: Secondary | ICD-10-CM | POA: Insufficient documentation

## 2013-02-17 DIAGNOSIS — F411 Generalized anxiety disorder: Secondary | ICD-10-CM | POA: Insufficient documentation

## 2013-02-17 DIAGNOSIS — Z5189 Encounter for other specified aftercare: Secondary | ICD-10-CM

## 2013-02-17 MED ORDER — OXYCODONE-ACETAMINOPHEN 5-325 MG PO TABS: 1.0000 | ORAL_TABLET | ORAL | Status: DC | PRN

## 2013-02-17 NOTE — ED Notes (Signed)
Here for wound recheck. Seen here previously with abscess rt forearm. Pt says the abscess is much better but cont to have pain."throbs".

## 2013-02-17 NOTE — ED Notes (Signed)
Here for a wound check left forearm.

## 2013-02-17 NOTE — ED Notes (Signed)
Pt says she gets no relief with percocet and wants rx for dilaudid.  Mayer Camel NP has already spoken to Dr Rosalia Hammers about this and pt cannot be ordered more dilaudid. Pt says " well we'll just be back up here or go to another hospital.".

## 2013-02-17 NOTE — ED Provider Notes (Signed)
CSN: [id number]     Arrival date & time 02/17/13  1757 History     First MD Initiated Contact with Patient 02/17/13 1832     Chief Complaint  Patient presents with  . Wound Check   (Consider location/radiation/quality/duration/timing/severity/associated sxs/prior Treatment) HPI [name] is a 37 y.o. female who presents to the ED for wound recheck. She had an infected area that required I&D 02/13/13. She returned for recheck on 8/16 and was given more IV antibiotics and returns today for recheck. She states the pain is still there but less. She has been taking Dilaudid for pain and Bactrim for infection.   Past Medical History  Diagnosis Date  . GERD (gastroesophageal reflux disease)   . Anxiety   . Cervical ca     [id number]  . Enterocolitis     admission 4/14-4/18   Past Surgical History  Procedure Laterality Date  . Laparoscopic cholecystectomy    . Cervical cone biopsy     Family History  Problem Relation Age of Onset  . GER disease Father   . GER disease Mother   . Colon polyps Mother   . Diverticulosis Mother    History  Substance Use Topics  . Smoking status: Current Every Day Smoker -- 1.00 packs/day for 15 years  . Smokeless tobacco: Not on file  . Alcohol Use: No   OB History   Grav Para Term Preterm Abortions TAB SAB Ect Mult Living                 Review of Systems  Constitutional: Negative for fever and chills.  Respiratory: Negative for cough.   Gastrointestinal: Negative for nausea and vomiting.  Musculoskeletal:       Infection right forearm  Skin: Positive for wound.  Neurological: Negative for headaches.  Psychiatric/Behavioral: The patient is not nervous/anxious.     Allergies  Review of patient's allergies indicates no known allergies.  Home Medications   Current Outpatient Rx  Name  Route  Sig  Dispense  Refill  . calcium carbonate (TUMS - DOSED IN MG ELEMENTAL CALCIUM) 500 MG chewable tablet   Oral   Chew 1 tablet by mouth  daily as needed.           Marland Kitchen DEXILANT 60 MG capsule      TAKE 1 CAPSULE (60 MG TOTAL) BY MOUTH DAILY.   30 capsule   11   . HYDROmorphone (DILAUDID) 2 MG tablet   Oral   Take 1-2 tablets (2-4 mg total) by mouth every 4 (four) hours as needed for pain.   20 tablet   0   . HYDROmorphone (DILAUDID) 4 MG tablet   Oral   Take 1 tablet (4 mg total) by mouth every 4 (four) hours as needed for pain.   20 tablet   0   . Levonorgest-Eth Estrad 91-Day (CAMRESE LO PO)   Oral   Take 1 tablet by mouth daily.           Marland Kitchen LORazepam (ATIVAN) 1 MG tablet      Take one every 8-12 hours for anxiety   10 tablet   0   . oxyCODONE-acetaminophen (PERCOCET/ROXICET) 5-325 MG per tablet   Oral   Take 1 tablet by mouth every 4 (four) hours as needed for pain.   10 tablet   0   . saccharomyces boulardii (FLORASTOR) 250 MG capsule   Oral   Take 250 mg by mouth 2 (two) times daily.           Marland Kitchen  sucralfate (CARAFATE) 1 GM/10ML suspension   Oral   Take 1 g by mouth 4 (four) times daily.           Marland Kitchen sulfamethoxazole-trimethoprim (BACTRIM DS,SEPTRA DS) 800-160 MG per tablet   Oral   Take 1 tablet by mouth 2 (two) times daily. One po bid x 3 days   20 tablet   0    BP 114/93  Pulse 79  Temp(Src) 97.9 F (36.6 C) (Oral)  Resp 20  Ht 5\' 5"  (1.651 m)  Wt 118 lb (53.524 kg)  BMI 19.64 kg/m2  SpO2 100%  LMP 11/13/2012 Physical Exam  Nursing note and vitals reviewed. Constitutional: She is oriented to person, place, and time. She appears well-developed and well-nourished.  HENT:  Head: Normocephalic and atraumatic.  Eyes: EOM are normal.  Neck: Normal range of motion. Neck supple.  Cardiovascular: Normal rate.   Pulmonary/Chest: Effort normal.  Musculoskeletal: She exhibits no edema.  Healing wound right forearm  Neurological: She is alert and oriented to person, place, and time. No cranial nerve deficit.  Skin:  There is a healing wound to the dorsal aspect of the right  forearm. minimal erythema and swelling noted today. No streaking. Radial pulse strong, adequate circulation.   Psychiatric: She has a normal mood and affect. Her behavior is normal.    ED Course   Procedures 1. Wound check, abscess     MDM  37 y.o. female with healing abscess of right forearm. She will continue the Bactrim for infection, start using warm wet compresses and wash the area in antibacterial soap. She will follow up with her  PCP or return here as needed. The patient requested another Rx for Dilaudid. She was given 20 tablets on 8/14 and 20 additional tablets 8/16. I explained that 5 days out from having the I&D the pain should not require Dilaudid. I explained that the warm wet compresses should help.  I discussed this case with Dr. Rosalia Hammers. Will give patient 10 Percocet.  Discussed with the patient and all questioned fully answered.   Medication List    TAKE these medications       oxyCODONE-acetaminophen 5-325 MG per tablet  Commonly known as:  PERCOCET/ROXICET  Take 1 tablet by mouth every 4 (four) hours as needed for pain.      ASK your doctor about these medications       calcium carbonate 500 MG chewable tablet  Commonly known as:  TUMS - dosed in mg elemental calcium  Chew 1 tablet by mouth daily as needed.     CAMRESE LO PO  Take 1 tablet by mouth daily.     DEXILANT 60 MG capsule  Generic drug:  dexlansoprazole  TAKE 1 CAPSULE (60 MG TOTAL) BY MOUTH DAILY.     HYDROmorphone 2 MG tablet  Commonly known as:  DILAUDID  Take 1-2 tablets (2-4 mg total) by mouth every 4 (four) hours as needed for pain.     HYDROmorphone 4 MG tablet  Commonly known as:  DILAUDID  Take 1 tablet (4 mg total) by mouth every 4 (four) hours as needed for pain.     LORazepam 1 MG tablet  Commonly known as:  ATIVAN  Take one every 8-12 hours for anxiety     saccharomyces boulardii 250 MG capsule  Commonly known as:  FLORASTOR  Take 250 mg by mouth 2 (two) times daily.      sucralfate 1 GM/10ML suspension  Commonly known as:  CARAFATE  Take 1 g by mouth 4 (four) times daily.     sulfamethoxazole-trimethoprim 800-160 MG per tablet  Commonly known as:  BACTRIM DS,SEPTRA DS  Take 1 tablet by mouth 2 (two) times daily. One po bid x 3 days        Call from [facility] and patient has been seeing MD in [place] and is taking Saboxone that she has filled at [facility].  She recently had the Rx filled. Will d/c order for Percocet and patient is to follow up with her doctor.   [facility] Orlene Och, [place] 02/18/13 [phone]

## 2013-02-19 NOTE — ED Provider Notes (Signed)
History/physical exam/procedure(s) were performed by non-physician practitioner and as supervising physician I was immediately available for consultation/collaboration. I have reviewed all notes and am in agreement with care and plan.   Hilario Quarry, MD 02/19/13 [phone]

## 2013-06-10 ENCOUNTER — Other Ambulatory Visit: Payer: Self-pay | Admitting: Urgent Care

## 2013-06-11 NOTE — Telephone Encounter (Signed)
She needs OV to continue to get refills from [place]. I will give one time refill.

## 2013-06-11 NOTE — Telephone Encounter (Signed)
[name], please schedule ov 

## 2013-06-12 ENCOUNTER — Encounter: Payer: Self-pay | Admitting: Internal Medicine

## 2013-06-12 NOTE — Telephone Encounter (Signed)
Pt is aware of OV on 1/13 at 8 with [facility] and appt was mailed

## 2013-07-15 ENCOUNTER — Ambulatory Visit: Payer: Medicaid Other | Admitting: Gastroenterology

## 2013-07-15 ENCOUNTER — Encounter (INDEPENDENT_AMBULATORY_CARE_PROVIDER_SITE_OTHER): Payer: Self-pay

## 2013-07-15 ENCOUNTER — Encounter: Payer: Self-pay | Admitting: Gastroenterology

## 2013-07-15 ENCOUNTER — Ambulatory Visit (INDEPENDENT_AMBULATORY_CARE_PROVIDER_SITE_OTHER): Payer: Medicaid Other | Admitting: Gastroenterology

## 2013-07-15 VITALS — BP 120/70 | HR 93 | Temp 98.1°F | Ht 65.0 in | Wt 129.4 lb

## 2013-07-15 DIAGNOSIS — K219 Gastro-esophageal reflux disease without esophagitis: Secondary | ICD-10-CM

## 2013-07-15 DIAGNOSIS — R1013 Epigastric pain: Secondary | ICD-10-CM

## 2013-07-15 MED ORDER — ESOMEPRAZOLE MAGNESIUM 40 MG PO CPDR: 40.0000 mg | DELAYED_RELEASE_CAPSULE | Freq: Two times a day (BID) | ORAL | Status: DC

## 2013-07-15 NOTE — Patient Instructions (Signed)
1. Stop Dexilant. Start Nexium 40mg  30 minutes before breakfast and your evening meal. After you have good control of your symptoms continue Nexium twice daily for at least 4 weeks. Then you may start tapering off of the evening dose as tolerated. 2. Office visit in six months. 3. Discuss use of Chantix with your primary care doctor.  Diet for Gastroesophageal Reflux Disease, Adult Reflux (acid reflux) is when acid from your stomach flows up into the esophagus. When acid comes in contact with the esophagus, the acid causes irritation and soreness (inflammation) in the esophagus. When reflux happens often or so severely that it causes damage to the esophagus, it is called gastroesophageal reflux disease (GERD). Nutrition therapy can help ease the discomfort of GERD. FOODS OR DRINKS TO AVOID OR LIMIT  Smoking or chewing tobacco. Nicotine is one of the most potent stimulants to acid production in the gastrointestinal tract.  Caffeinated and decaffeinated coffee and black tea.  Regular or low-calorie carbonated beverages or energy drinks (caffeine-free carbonated beverages are allowed).   Strong spices, such as black pepper, white pepper, red pepper, cayenne, curry powder, and chili powder.  Peppermint or spearmint.  Chocolate.  High-fat foods, including meats and fried foods. Extra added fats including oils, butter, salad dressings, and nuts. Limit these to less than 8 tsp per day.  Fruits and vegetables if they are not tolerated, such as citrus fruits or tomatoes.  Alcohol.  Any food that seems to aggravate your condition. If you have questions regarding your diet, call your caregiver or a registered dietitian. OTHER THINGS THAT MAY HELP GERD INCLUDE:   Eating your meals slowly, in a relaxed setting.  Eating 5 to 6 small meals per day instead of 3 large meals.  Eliminating food for a period of time if it causes distress.  Not lying down until 3 hours after eating a meal.  Keeping  the head of your bed raised 6 to 9 inches (15 to 23 cm) by using a foam wedge or blocks under the legs of the bed. Lying flat may make symptoms worse.  Being physically active. Weight loss may be helpful in reducing reflux in overweight or obese adults.  Wear loose fitting clothing EXAMPLE MEAL PLAN This meal plan is approximately 2,000 calories based on [email or web address] meal planning guidelines. Breakfast   cup cooked oatmeal.  1 cup strawberries.  1 cup low-fat milk.  1 oz almonds. Snack  1 cup cucumber slices.  6 oz yogurt (made from low-fat or fat-free milk). Lunch  2 slice whole-wheat bread.  2 oz sliced [place].  2 tsp mayonnaise.  1 cup blueberries.  1 cup snap peas. Snack  6 whole-wheat crackers.  1 oz string cheese. Dinner   cup brown rice.  1 cup mixed veggies.  1 tsp olive oil.  3 oz grilled fish. Document Released: 06/19/2005 Document Revised: 09/11/2011 Document Reviewed: 05/05/2011 [facility] Patient Information 2014 [place], [place].

## 2013-07-15 NOTE — Progress Notes (Signed)
      Primary Care Physician: Celedonio Savage, MD  Primary Gastroenterologist:  Garfield Cornea, MD   Chief Complaint  Patient presents with  . Medication Refill    HPI: [name] is a 38 y.o. female here for followup of acid reflux disease. She was last seen back in 2012. She has been on Dexilant 60mg  daily. Previously did very well on it but has been having breakthrough symptoms about 50% of the time of the past several months. If misses dose, does terrible. 50 % of time, breakthrough symptoms. Tums, tagamet as needed. Nausea, stabbing epigastric pain with breakthrough. Drinking lot of sodas. 3-4 cans of George per day. If lay on left side helps. Symptoms can last for a few minutes to three days at worst. Previously failed Prilosec. Breakthrough symptoms always seem to be in the evening. Bowel movements are regular. No blood in the stool or melena. She's gained over 15 pounds since we last saw her.  Current Outpatient Prescriptions  Medication Sig Dispense Refill  . calcium carbonate (TUMS - DOSED IN MG ELEMENTAL CALCIUM) 500 MG chewable tablet Chew 1 tablet by mouth daily as needed.        Marland Kitchen DEXILANT 60 MG capsule TAKE 1 CAPSULE (60 MG TOTAL) BY MOUTH DAILY.  30 capsule  0  . Levonorgest-Eth Estrad 91-Day (CAMRESE LO PO) Take 1 tablet by mouth daily.         No current facility-administered medications for this visit.    Allergies as of 07/15/2013  . (No Known Allergies)   Past Surgical History  Procedure Laterality Date  . Laparoscopic cholecystectomy    . Cervical cone biopsy    . Esophagogastroduodenoscopy  11/14/2010    ZHG:DJME distal esophageal erosions consistent with mild erosive reflux esophagitis  . Colonoscopy  11/14/2010    QAS:[id number] polyps ablated and/or snared from the rectum, otherwise normal-appearing rectum and colon. Erosions in the terminal ileum status post biopsy     ROS:  General: Negative for anorexia, weight loss, fever, chills,  fatigue, weakness. ENT: Negative for hoarseness, difficulty swallowing , nasal congestion. CV: Negative for chest pain, angina, palpitations, dyspnea on exertion, peripheral edema.  Respiratory: Negative for dyspnea at rest, dyspnea on exertion, cough, sputum, wheezing.  GI: See history of present illness. GU:  Negative for dysuria, hematuria, urinary incontinence, urinary frequency, nocturnal urination.  Endo: Negative for unusual weight change.    Physical Examination:   BP 120/70  Pulse 93  Temp(Src) 98.1 F (36.7 C) (Oral)  Ht 5\' 5"  (1.651 m)  Wt 129 lb 6.4 oz (58.695 kg)  BMI 21.53 kg/m2  General: Well-nourished, well-developed in no acute distress.  Eyes: No icterus. Mouth: Oropharyngeal mucosa moist and pink , no lesions erythema or exudate. Lungs: Clear to auscultation bilaterally.  Heart: Regular rate and rhythm, no murmurs rubs or gallops.  Abdomen: Bowel sounds are normal, nontender, nondistended, no hepatosplenomegaly or masses, no abdominal bruits or hernia , no rebound or guarding.   Extremities: No lower extremity edema. No clubbing or deformities. Neuro: Alert and oriented x 4   Skin: Warm and dry, no jaundice.   Psych: Alert and cooperative, normal mood and affect.

## 2013-07-15 NOTE — Assessment & Plan Note (Addendum)
History of erosive reflux esophagitis. Having refractory symptoms in the way of nausea and epigastric discomfort about 50% of the time. Stop the Dexilant. Start Nexium 40mg  BID. If her symptoms are well-controlled, would continue twice a day for at least 4 weeks. Today she did try to taper off the evening dose. Discussed antireflux measures. Consider dropping 10 pounds, decrease carbonated beverages significantly, avoid fatty/greasy foods. Handout provided. Ov in six months or sooner if needed. If she is doing well on nexium once daily at six months, then we can extend her follow up to one year.

## 2013-07-16 NOTE — Progress Notes (Signed)
cc'd to pcp 

## 2014-01-28 ENCOUNTER — Encounter: Payer: Self-pay | Admitting: Internal Medicine

## 2014-03-20 ENCOUNTER — Emergency Department (HOSPITAL_COMMUNITY)
Admission: EM | Admit: 2014-03-20 | Discharge: 2014-03-20 | Disposition: A | Discharge status: Home / Self Care | Payer: Medicaid Other | Attending: Emergency Medicine | Admitting: Emergency Medicine

## 2014-03-20 DIAGNOSIS — R220 Localized swelling, mass and lump, head: Secondary | ICD-10-CM | POA: Insufficient documentation

## 2014-03-20 DIAGNOSIS — K219 Gastro-esophageal reflux disease without esophagitis: Secondary | ICD-10-CM | POA: Diagnosis not present

## 2014-03-20 DIAGNOSIS — Z8541 Personal history of malignant neoplasm of cervix uteri: Secondary | ICD-10-CM | POA: Diagnosis not present

## 2014-03-20 DIAGNOSIS — Z792 Long term (current) use of antibiotics: Secondary | ICD-10-CM | POA: Insufficient documentation

## 2014-03-20 DIAGNOSIS — F17200 Nicotine dependence, unspecified, uncomplicated: Secondary | ICD-10-CM | POA: Diagnosis not present

## 2014-03-20 DIAGNOSIS — Z79899 Other long term (current) drug therapy: Secondary | ICD-10-CM | POA: Insufficient documentation

## 2014-03-20 DIAGNOSIS — K047 Periapical abscess without sinus: Secondary | ICD-10-CM | POA: Insufficient documentation

## 2014-03-20 DIAGNOSIS — Z8659 Personal history of other mental and behavioral disorders: Secondary | ICD-10-CM | POA: Insufficient documentation

## 2014-03-20 DIAGNOSIS — R221 Localized swelling, mass and lump, neck: Secondary | ICD-10-CM | POA: Diagnosis present

## 2014-03-20 MED ORDER — CEFTRIAXONE SODIUM 1 G IJ SOLR
1.0000 g | Freq: Once | INTRAMUSCULAR | Status: AC
  Administered 2014-03-20: 1 g via INTRAMUSCULAR
  Filled 2014-03-20: qty 10

## 2014-03-20 MED ORDER — OXYCODONE-ACETAMINOPHEN 7.5-325 MG PO TABS: 1.0000 | ORAL_TABLET | ORAL | Status: DC | PRN

## 2014-03-20 MED ORDER — OXYCODONE-ACETAMINOPHEN 5-325 MG PO TABS: 2.0000 | ORAL_TABLET | ORAL | Status: DC | PRN

## 2014-03-20 MED ORDER — OXYCODONE-ACETAMINOPHEN 5-325 MG PO TABS
2.0000 | ORAL_TABLET | Freq: Once | ORAL | Status: AC
  Administered 2014-03-20: 2 via ORAL
  Filled 2014-03-20: qty 2

## 2014-03-20 MED ORDER — LIDOCAINE HCL (PF) 1 % IJ SOLN
INTRAMUSCULAR | Status: AC
  Administered 2014-03-20: 01:00:00
  Filled 2014-03-20: qty 5

## 2014-03-20 NOTE — ED Provider Notes (Signed)
CSN: [id number]     Arrival date & time 03/20/14  0037 History   First MD Initiated Contact with Patient 03/20/14 0048     Chief Complaint  Patient presents with  . Oral Swelling     (Consider location/radiation/quality/duration/timing/severity/associated sxs/prior Treatment) Patient is a 38 y.o. female presenting with tooth pain. The history is provided by the patient.  Dental Pain Location:  Lower Lower teeth location:  21/LL 1st bicuspid and 20/LL 2nd bicuspid Quality:  Throbbing and constant Onset quality:  Gradual Duration:  1 week Timing:  Constant Progression:  Worsening Chronicity:  New Context: abscess   Previous work-up:  Dental exam Relieved by:  Nothing Worsened by:  Cold food/drink and pressure Ineffective treatments: antibiotics and pain medication. Associated symptoms: facial pain, facial swelling and gum swelling   Associated symptoms: no drooling, no neck swelling and no trismus   Risk factors: smoking    [name] is a 38 y.o. female who presents to the ED with dental pain. The pain started last week. The patient went to her dentist 3 days ago and was started on antibiotics and hydrocodone. She continues to have pain and the swelling to the left side of her face has increased. She called her dentist this afternoon and they told her if she had a lot of swelling to come to the ED. Patient denies fever or chills.   Past Medical History  Diagnosis Date  . GERD (gastroesophageal reflux disease)   . Anxiety   . Cervical ca     [id number]  . Enterocolitis     admission 4/14-4/18   Past Surgical History  Procedure Laterality Date  . Laparoscopic cholecystectomy    . Cervical cone biopsy    . Esophagogastroduodenoscopy  11/14/2010    IBB:CWUG distal esophageal erosions consistent with mild erosive reflux esophagitis  . Colonoscopy  11/14/2010    QBV:[id number] polyps ablated and/or snared from the rectum, otherwise normal-appearing rectum and  colon. Erosions in the terminal ileum status post biopsy    Family History  Problem Relation Age of Onset  . GER disease Father   . GER disease Mother   . Colon polyps Mother   . Diverticulosis Mother    History  Substance Use Topics  . Smoking status: Current Every Day Smoker -- 1.00 packs/day for 15 years  . Smokeless tobacco: Not on file  . Alcohol Use: No   OB History   Grav Para Term Preterm Abortions TAB SAB Ect Mult Living                 Review of Systems  HENT: Positive for dental problem and facial swelling. Negative for drooling.   all other systems negative    Allergies  Review of patient's allergies indicates no known allergies.  Home Medications   Prior to Admission medications   Medication Sig Start Date End Date Taking? Authorizing Provider  calcium carbonate (TUMS - DOSED IN MG ELEMENTAL CALCIUM) 500 MG chewable tablet Chew 1 tablet by mouth daily as needed.     Yes Historical Provider, MD  clindamycin (CLEOCIN) 300 MG capsule Take 300 mg by mouth 3 (three) times daily.   Yes Historical Provider, MD  esomeprazole (NEXIUM) 40 MG capsule Take 1 capsule (40 mg total) by mouth 2 (two) times daily before a meal. 07/15/13  Yes Mahala Menghini, PA-C  HYDROcodone-acetaminophen (NORCO/VICODIN) 5-325 MG per tablet Take 1 tablet by mouth every 4 (four) hours as needed for moderate pain.  Yes Historical Provider, MD  HYDROmorphone (DILAUDID) 2 MG tablet Take by mouth every 4 (four) hours as needed for severe pain (Not patient's med).   Yes Historical Provider, MD  Levonorgest-Eth Estrad 91-Day (CAMRESE LO PO) Take 1 tablet by mouth daily.     Yes Historical Provider, MD   BP 129/87  Pulse 87  Temp(Src) 98 F (36.7 C) (Oral)  Resp 20  Ht 5\' 5"  (1.651 m)  Wt 120 lb (54.432 kg)  BMI 19.97 kg/m2  SpO2 100% Physical Exam  Nursing note and vitals reviewed. Constitutional: She is oriented to person, place, and time. She appears well-developed and well-nourished.    HENT:  Right Ear: Tympanic membrane normal.  Left Ear: Tympanic membrane normal.  Mouth/Throat: Uvula is midline, oropharynx is clear and moist and mucous membranes are normal.    Facial swelling left noted. Tender with palpation. Decayed teeth in the area of the swelling, gum surrounding the decayed teeth has erythema and swelling. Tender on exam.   Eyes: EOM are normal.  Neck: Neck supple.  Cardiovascular: Normal rate.   Pulmonary/Chest: Effort normal.  Musculoskeletal: Normal range of motion.  Neurological: She is alert and oriented to person, place, and time. No cranial nerve deficit.  Skin: Skin is warm and dry.  Psychiatric: She has a normal mood and affect. Her behavior is normal.    ED Course  Procedures  Patient request I&D of the gum. I discussed with Dr. Roderic Palau and he suggest we get the patient's pain under control and have her discuss with her dentist or the oral surgeon in the morning.  Rocephin 1 gram IM MDM  38 y.o. female with dental abscess and facial swelling. Will treat for pain and she will continue her antibiotics. She will call her dentist or the oral surgeon in the morning for follow up. Stable for discharge without fever or signs of sepsis. Discussed with the patient and all questioned fully answered. Will give pre pack of Percocet for patient to go home with tonight.    Medication List    TAKE these medications       oxyCODONE-acetaminophen 7.5-325 MG per tablet  Commonly known as:  PERCOCET  Take 1 tablet by mouth every 4 (four) hours as needed for pain.     oxyCODONE-acetaminophen 5-325 MG per tablet  Commonly known as:  PERCOCET/ROXICET  Take 2 tablets by mouth every 4 (four) hours as needed for moderate pain or severe pain.      ASK your doctor about these medications       calcium carbonate 500 MG chewable tablet  Commonly known as:  TUMS - dosed in mg elemental calcium  Chew 1 tablet by mouth daily as needed.     CAMRESE LO PO  Take 1  tablet by mouth daily.     clindamycin 300 MG capsule  Commonly known as:  CLEOCIN  Take 300 mg by mouth 3 (three) times daily.     esomeprazole 40 MG capsule  Commonly known as:  NEXIUM  Take 1 capsule (40 mg total) by mouth 2 (two) times daily before a meal.     HYDROcodone-acetaminophen 5-325 MG per tablet  Commonly known as:  NORCO/VICODIN  Take 1 tablet by mouth every 4 (four) hours as needed for moderate pain.     HYDROmorphone 2 MG tablet  Commonly known as:  DILAUDID  Take by mouth every 4 (four) hours as needed for severe pain (Not patient's med).  Ashley Mur[place], [place] 03/20/14 2100

## 2014-03-20 NOTE — ED Notes (Signed)
NP at bedside.

## 2014-03-20 NOTE — Discharge Instructions (Signed)

## 2014-03-21 NOTE — ED Provider Notes (Signed)
Medical screening examination/treatment/procedure(s) were performed by non-physician practitioner and as supervising physician I was immediately available for consultation/collaboration.   EKG Interpretation None        Maudry Diego, MD 03/21/14 [phone]

## 2014-04-15 ENCOUNTER — Telehealth: Payer: Self-pay | Admitting: Internal Medicine

## 2014-04-15 NOTE — Telephone Encounter (Signed)
PATIENT LEFT A MESSAGE STATING THAT SHE HAD NO MORE REFILLS ON HER NEXIUM AND IT REQUIRES AUTHORIZATION.  PLEASE ADVISE [phone]

## 2014-04-16 ENCOUNTER — Other Ambulatory Visit: Payer: Self-pay | Admitting: Gastroenterology

## 2014-04-20 NOTE — Telephone Encounter (Signed)
rx refills for nexium was sent in on 04/16/14. pts PA with [employer] [employer] was done in January 2015 and was good for 1 year. I called [facility]. LMOM asking them to let me know if they were unable to process this for the pt.

## 2014-07-20 ENCOUNTER — Emergency Department: Payer: Self-pay | Admitting: Emergency Medicine

## 2014-07-20 LAB — URINALYSIS, COMPLETE
BACTERIA: NONE SEEN
BILIRUBIN, UR: NEGATIVE
Blood: NEGATIVE
GLUCOSE, UR: NEGATIVE mg/dL (ref 0–75)
KETONE: NEGATIVE
Nitrite: NEGATIVE
Ph: 5 (ref 4.5–8.0)
Protein: NEGATIVE
RBC,UR: 2 /HPF (ref 0–5)
Specific Gravity: 1.024 (ref 1.003–1.030)
Squamous Epithelial: 8

## 2014-07-20 LAB — COMPREHENSIVE METABOLIC PANEL
ALT: 24 U/L
Albumin: 3.8 g/dL (ref 3.4–5.0)
Alkaline Phosphatase: 98 U/L
Anion Gap: 9 (ref 7–16)
BUN: 7 mg/dL (ref 7–18)
Bilirubin,Total: 0.3 mg/dL (ref 0.2–1.0)
CHLORIDE: 108 mmol/L — AB (ref 98–107)
CO2: 26 mmol/L (ref 21–32)
CREATININE: 0.81 mg/dL (ref 0.60–1.30)
Calcium, Total: 8.9 mg/dL (ref 8.5–10.1)
Glucose: 123 mg/dL — ABNORMAL HIGH (ref 65–99)
Osmolality: 284 (ref 275–301)
POTASSIUM: 3.5 mmol/L (ref 3.5–5.1)
SGOT(AST): 24 U/L (ref 15–37)
Sodium: 143 mmol/L (ref 136–145)
Total Protein: 7.8 g/dL (ref 6.4–8.2)

## 2014-07-20 LAB — CBC
HCT: 45.8 % (ref 35.0–47.0)
HGB: 15.7 g/dL (ref 12.0–16.0)
MCH: 30.9 pg (ref 26.0–34.0)
MCHC: 34.2 g/dL (ref 32.0–36.0)
MCV: 90 fL (ref 80–100)
PLATELETS: 193 10*3/uL (ref 150–440)
RBC: 5.07 10*6/uL (ref 3.80–5.20)
RDW: 13.9 % (ref 11.5–14.5)
WBC: 8.9 10*3/uL (ref 3.6–11.0)

## 2014-07-20 LAB — DRUG SCREEN, URINE
Amphetamines, Ur Screen: NEGATIVE (ref ?–1000)
BARBITURATES, UR SCREEN: NEGATIVE (ref ?–200)
Benzodiazepine, Ur Scrn: NEGATIVE (ref ?–200)
Cannabinoid 50 Ng, Ur ~~LOC~~: NEGATIVE (ref ?–50)
Cocaine Metabolite,Ur ~~LOC~~: NEGATIVE (ref ?–300)
MDMA (Ecstasy)Ur Screen: NEGATIVE (ref ?–500)
METHADONE, UR SCREEN: NEGATIVE (ref ?–300)
Opiate, Ur Screen: POSITIVE (ref ?–300)
Phencyclidine (PCP) Ur S: NEGATIVE (ref ?–25)
TRICYCLIC, UR SCREEN: NEGATIVE (ref ?–1000)

## 2014-07-20 LAB — SALICYLATE LEVEL: SALICYLATES, SERUM: 5.3 mg/dL — AB

## 2014-07-20 LAB — ETHANOL: Ethanol: <3 mg/dL

## 2014-07-20 LAB — ACETAMINOPHEN LEVEL: Acetaminophen: <2 ug/mL

## 2014-07-23 ENCOUNTER — Emergency Department (HOSPITAL_COMMUNITY)
Admission: EM | Admit: 2014-07-23 | Discharge: 2014-07-24 | Disposition: A | Discharge status: Home / Self Care | Payer: MEDICAID | Attending: Emergency Medicine | Admitting: Emergency Medicine

## 2014-07-23 ENCOUNTER — Encounter (HOSPITAL_COMMUNITY): Payer: Self-pay | Admitting: Emergency Medicine

## 2014-07-23 DIAGNOSIS — Z792 Long term (current) use of antibiotics: Secondary | ICD-10-CM | POA: Diagnosis not present

## 2014-07-23 DIAGNOSIS — Z8659 Personal history of other mental and behavioral disorders: Secondary | ICD-10-CM | POA: Diagnosis not present

## 2014-07-23 DIAGNOSIS — F1110 Opioid abuse, uncomplicated: Secondary | ICD-10-CM

## 2014-07-23 DIAGNOSIS — Z720 Tobacco use: Secondary | ICD-10-CM | POA: Diagnosis not present

## 2014-07-23 DIAGNOSIS — K219 Gastro-esophageal reflux disease without esophagitis: Secondary | ICD-10-CM | POA: Insufficient documentation

## 2014-07-23 DIAGNOSIS — Z79899 Other long term (current) drug therapy: Secondary | ICD-10-CM | POA: Insufficient documentation

## 2014-07-23 DIAGNOSIS — Z3202 Encounter for pregnancy test, result negative: Secondary | ICD-10-CM | POA: Insufficient documentation

## 2014-07-23 DIAGNOSIS — Z8541 Personal history of malignant neoplasm of cervix uteri: Secondary | ICD-10-CM | POA: Diagnosis not present

## 2014-07-23 HISTORY — DX: Opioid dependence, uncomplicated: F11.20

## 2014-07-23 LAB — URINALYSIS, ROUTINE W REFLEX MICROSCOPIC
Glucose, UA: NEGATIVE mg/dL
HGB URINE DIPSTICK: NEGATIVE
Ketones, ur: 15 mg/dL — AB
Leukocytes, UA: NEGATIVE
NITRITE: NEGATIVE
PROTEIN: 30 mg/dL — AB
SPECIFIC GRAVITY, URINE: 1.04 — AB (ref 1.005–1.030)
UROBILINOGEN UA: 0.2 mg/dL (ref 0.0–1.0)
pH: 5 (ref 5.0–8.0)

## 2014-07-23 LAB — URINE MICROSCOPIC-ADD ON

## 2014-07-23 LAB — COMPREHENSIVE METABOLIC PANEL
ALT: 19 U/L (ref 0–35)
AST: 29 U/L (ref 0–37)
Albumin: 4.7 g/dL (ref 3.5–5.2)
Alkaline Phosphatase: 95 U/L (ref 39–117)
Anion gap: 14 (ref 5–15)
BUN: 10 mg/dL (ref 6–23)
CO2: 23 mmol/L (ref 19–32)
CREATININE: 0.99 mg/dL (ref 0.50–1.10)
Calcium: 9.5 mg/dL (ref 8.4–10.5)
Chloride: 103 mEq/L (ref 96–112)
GFR calc Af Amer: 83 mL/min — ABNORMAL LOW (ref >90)
GFR, EST NON AFRICAN AMERICAN: 71 mL/min — AB (ref >90)
GLUCOSE: 150 mg/dL — AB (ref 70–99)
Potassium: 3.3 mmol/L — ABNORMAL LOW (ref 3.5–5.1)
Sodium: 140 mmol/L (ref 135–145)
TOTAL PROTEIN: 7.9 g/dL (ref 6.0–8.3)
Total Bilirubin: 0.7 mg/dL (ref 0.3–1.2)

## 2014-07-23 LAB — RAPID URINE DRUG SCREEN, HOSP PERFORMED
Amphetamines: NOT DETECTED
Barbiturates: NOT DETECTED
Benzodiazepines: NOT DETECTED
Cocaine: NOT DETECTED
Opiates: POSITIVE — AB
Tetrahydrocannabinol: NOT DETECTED

## 2014-07-23 LAB — CBC WITH DIFFERENTIAL/PLATELET
BASOS PCT: 0 % (ref 0–1)
Basophils Absolute: 0 10*3/uL (ref 0.0–0.1)
EOS PCT: 1 % (ref 0–5)
Eosinophils Absolute: 0.1 10*3/uL (ref 0.0–0.7)
HEMATOCRIT: 46.6 % — AB (ref 36.0–46.0)
Hemoglobin: 16.4 g/dL — ABNORMAL HIGH (ref 12.0–15.0)
Lymphocytes Relative: 29 % (ref 12–46)
Lymphs Abs: 2.9 10*3/uL (ref 0.7–4.0)
MCH: 31.1 pg (ref 26.0–34.0)
MCHC: 35.2 g/dL (ref 30.0–36.0)
MCV: 88.3 fL (ref 78.0–100.0)
MONO ABS: 0.5 10*3/uL (ref 0.1–1.0)
MONOS PCT: 5 % (ref 3–12)
NEUTROS PCT: 65 % (ref 43–77)
Neutro Abs: 6.4 10*3/uL (ref 1.7–7.7)
Platelets: 210 10*3/uL (ref 150–400)
RBC: 5.28 MIL/uL — AB (ref 3.87–5.11)
RDW: 13.6 % (ref 11.5–15.5)
WBC: 9.9 10*3/uL (ref 4.0–10.5)

## 2014-07-23 LAB — ETHANOL: Alcohol, Ethyl (B): <5 mg/dL (ref 0–9)

## 2014-07-23 LAB — POC URINE PREG, ED: PREG TEST UR: NEGATIVE

## 2014-07-23 MED ORDER — CLONIDINE HCL 0.1 MG PO TABS: 0.1000 mg | ORAL_TABLET | ORAL | Status: DC

## 2014-07-23 MED ORDER — CLONIDINE HCL 0.1 MG PO TABS: 0.1000 mg | ORAL_TABLET | Freq: Every day | ORAL | Status: DC

## 2014-07-23 MED ORDER — LEVONORGEST-ETH ESTRAD 91-DAY 0.1-0.02 & 0.01 MG PO TABS: 1.0000 | ORAL_TABLET | Freq: Every day | ORAL | Status: DC

## 2014-07-23 MED ORDER — PANTOPRAZOLE SODIUM 40 MG PO TBEC: 80.0000 mg | DELAYED_RELEASE_TABLET | Freq: Every day | ORAL | Status: DC

## 2014-07-23 MED ORDER — POTASSIUM CHLORIDE CRYS ER 20 MEQ PO TBCR
40.0000 meq | EXTENDED_RELEASE_TABLET | Freq: Once | ORAL | Status: AC
  Administered 2014-07-24: 40 meq via ORAL
  Filled 2014-07-23: qty 2

## 2014-07-23 MED ORDER — ACETAMINOPHEN 325 MG PO TABS: 650.0000 mg | ORAL_TABLET | ORAL | Status: DC | PRN

## 2014-07-23 MED ORDER — ONDANSETRON HCL 4 MG PO TABS: 4.0000 mg | ORAL_TABLET | Freq: Three times a day (TID) | ORAL | Status: DC | PRN

## 2014-07-23 MED ORDER — CLONIDINE HCL 0.1 MG PO TABS
0.1000 mg | ORAL_TABLET | Freq: Four times a day (QID) | ORAL | Status: DC
  Administered 2014-07-23: 0.1 mg via ORAL
  Filled 2014-07-23: qty 1

## 2014-07-23 MED ORDER — LOPERAMIDE HCL 2 MG PO CAPS
2.0000 mg | ORAL_CAPSULE | ORAL | Status: DC | PRN
Start: 2014-07-23 — End: 2014-07-24

## 2014-07-23 MED ORDER — DICYCLOMINE HCL 20 MG PO TABS: 20.0000 mg | ORAL_TABLET | Freq: Four times a day (QID) | ORAL | Status: DC | PRN

## 2014-07-23 MED ORDER — NICOTINE 21 MG/24HR TD PT24
21.0000 mg | MEDICATED_PATCH | Freq: Every day | TRANSDERMAL | Status: DC
  Administered 2014-07-23: 21 mg via TRANSDERMAL
  Filled 2014-07-23: qty 1

## 2014-07-23 MED ORDER — IBUPROFEN 400 MG PO TABS: 600.0000 mg | ORAL_TABLET | Freq: Three times a day (TID) | ORAL | Status: DC | PRN

## 2014-07-23 MED ORDER — HYDROXYZINE HCL 25 MG PO TABS
25.0000 mg | ORAL_TABLET | Freq: Four times a day (QID) | ORAL | Status: DC | PRN
  Administered 2014-07-24: 25 mg via ORAL
  Filled 2014-07-23: qty 1

## 2014-07-23 MED ORDER — METHOCARBAMOL 500 MG PO TABS: 500.0000 mg | ORAL_TABLET | Freq: Three times a day (TID) | ORAL | Status: DC | PRN

## 2014-07-23 MED ORDER — NAPROXEN 250 MG PO TABS: 500.0000 mg | ORAL_TABLET | Freq: Two times a day (BID) | ORAL | Status: DC | PRN

## 2014-07-23 NOTE — ED Notes (Signed)
Called for traige x2

## 2014-07-23 NOTE — ED Provider Notes (Signed)
CSN: [id number]     Arrival date & time 07/23/14  1905 History   First MD Initiated Contact with Patient 07/23/14 1939     Chief Complaint  Patient presents with  . Addiction Problem     (Consider location/radiation/quality/duration/timing/severity/associated sxs/prior Treatment) HPI [name] is a 39 y.o. female history of anxiety, cervical cancer in remission since 1995, GERD, presents to emergency department requesting detox from heroin. Patient states she uses daily. Has used for years. Last used at 3 PM this afternoon. She has tried to stop at home but couldn't. Patient called [facility] and was told to come to emergency department to be medically cleared. She states that they told her they had beds available. Patient denies any suicidal or homicidal ideations. Denies any alcohol problems. No other drug abuse.  Past Medical History  Diagnosis Date  . GERD (gastroesophageal reflux disease)   . Anxiety   . Cervical ca     [id number]  . Enterocolitis     admission 4/14-4/18  . Heroin addiction    Past Surgical History  Procedure Laterality Date  . Laparoscopic cholecystectomy    . Cervical cone biopsy    . Esophagogastroduodenoscopy  11/14/2010    GPQ:DIYM distal esophageal erosions consistent with mild erosive reflux esophagitis  . Colonoscopy  11/14/2010    EBR:[id number] polyps ablated and/or snared from the rectum, otherwise normal-appearing rectum and colon. Erosions in the terminal ileum status post biopsy    Family History  Problem Relation Age of Onset  . GER disease Father   . GER disease Mother   . Colon polyps Mother   . Diverticulosis Mother    History  Substance Use Topics  . Smoking status: Current Every Day Smoker -- 1.00 packs/day for 15 years  . Smokeless tobacco: Not on file  . Alcohol Use: No   OB History    No data available     Review of Systems  Constitutional: Negative for fever and chills.  Respiratory: Negative for cough, chest  tightness and shortness of breath.   Cardiovascular: Negative for chest pain, palpitations and leg swelling.  Gastrointestinal: Negative for nausea, vomiting, abdominal pain and diarrhea.  Genitourinary: Negative for dysuria and flank pain.  Musculoskeletal: Negative for myalgias, arthralgias, neck pain and neck stiffness.  Skin: Negative for rash.  Neurological: Negative for dizziness, weakness and headaches.  Psychiatric/Behavioral:       Substance abuse  All other systems reviewed and are negative.     Allergies  Review of patient's allergies indicates no known allergies.  Home Medications   Prior to Admission medications   Medication Sig Start Date End Date Taking? Authorizing Provider  calcium carbonate (TUMS - DOSED IN MG ELEMENTAL CALCIUM) 500 MG chewable tablet Chew 1 tablet by mouth daily as needed.      Historical Provider, MD  clindamycin (CLEOCIN) 300 MG capsule Take 300 mg by mouth 3 (three) times daily.    Historical Provider, MD  esomeprazole (NEXIUM) 40 MG capsule TAKE 1 CAPSULE (40 MG TOTAL) BY MOUTH 2 (TWO) TIMES DAILY BEFORE A MEAL. 04/16/14   Orvil Feil, NP  HYDROcodone-acetaminophen (NORCO/VICODIN) 5-325 MG per tablet Take 1 tablet by mouth every 4 (four) hours as needed for moderate pain.    Historical Provider, MD  HYDROmorphone (DILAUDID) 2 MG tablet Take by mouth every 4 (four) hours as needed for severe pain (Not patient's med).    Historical Provider, MD  Levonorgest-Eth Estrad 91-Day (CAMRESE LO PO) Take 1 tablet by  mouth daily.      Historical Provider, MD  oxyCODONE-acetaminophen (PERCOCET) 7.5-325 MG per tablet Take 1 tablet by mouth every 4 (four) hours as needed for pain. 03/20/14   [place], NP  oxyCODONE-acetaminophen (PERCOCET/ROXICET) 5-325 MG per tablet Take 2 tablets by mouth every 4 (four) hours as needed for moderate pain or severe pain. 03/20/14   [place], NP   BP 134/85 mmHg  Pulse 99  Temp(Src) 97.7 F (36.5 C) (Oral)  Resp 20   SpO2 98%  LMP 07/21/2014 Physical Exam  Constitutional: She is oriented to person, place, and time. She appears well-developed and well-nourished. No distress.  HENT:  Head: Normocephalic.  Eyes: Conjunctivae are normal.  Neck: Neck supple.  Cardiovascular: Normal rate, regular rhythm and normal heart sounds.   Pulmonary/Chest: Effort normal and breath sounds normal. No respiratory distress. She has no wheezes. She has no rales.  Abdominal: Soft. Bowel sounds are normal. She exhibits no distension. There is no tenderness. There is no rebound.  Musculoskeletal: She exhibits no edema.  Neurological: She is alert and oriented to person, place, and time.  Skin: Skin is warm and dry.  Psychiatric: She has a normal mood and affect. Her behavior is normal.  Nursing note and vitals reviewed.   ED Course  Procedures (including critical care time) Labs Review Labs Reviewed  CBC WITH DIFFERENTIAL  COMPREHENSIVE METABOLIC PANEL  URINE RAPID DRUG SCREEN (HOSP PERFORMED)  URINALYSIS, ROUTINE W REFLEX MICROSCOPIC  ETHANOL  POC URINE PREG, ED    Imaging Review No results found.   EKG Interpretation None      MDM   Final diagnoses:  None    Patient is here for heroine detox, medical clearance. Spoke with [facility], was instructed to come here. We will give medical clearance blood work and urinalysis done. I discussed with her that we do not do opiate detoxes anymore through emergency department. Patient however is adamant about being seen by [facility], and said the [facility] told her to come here. I will contact [facility] to see if they can get her into [facility].   8:24 PM Pt signed out with PA piepenbrick pending [facility] consult and med clearance labs. No SI or HI.    Renold Genta, PA-C 07/23/14 2025  Fredia Sorrow, MD 07/24/14 [phone]
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
## 2014-07-23 NOTE — ED Notes (Signed)
Pt. requesting detox from Heroin addiction , denies suicidal ideation / no hallucinations or delusions , last Heroin today .

## 2014-07-24 MED ORDER — METHOCARBAMOL 500 MG PO TABS: 500.0000 mg | ORAL_TABLET | Freq: Three times a day (TID) | ORAL | Status: DC | PRN

## 2014-07-24 MED ORDER — DICYCLOMINE HCL 20 MG PO TABS: 20.0000 mg | ORAL_TABLET | Freq: Four times a day (QID) | ORAL | Status: DC | PRN

## 2014-07-24 MED ORDER — HYDROXYZINE HCL 25 MG PO TABS: 25.0000 mg | ORAL_TABLET | Freq: Four times a day (QID) | ORAL | Status: DC | PRN

## 2014-07-24 MED ORDER — ONDANSETRON HCL 4 MG PO TABS: 4.0000 mg | ORAL_TABLET | Freq: Three times a day (TID) | ORAL | Status: DC | PRN

## 2014-07-24 MED ORDER — NAPROXEN 500 MG PO TABS: 500.0000 mg | ORAL_TABLET | Freq: Two times a day (BID) | ORAL | Status: DC | PRN

## 2014-07-24 MED ORDER — LOPERAMIDE HCL 2 MG PO CAPS: 2.0000 mg | ORAL_CAPSULE | ORAL | Status: DC | PRN

## 2014-07-24 NOTE — ED Notes (Signed)
Pt is agitated, stating she gets withdrawal sx similar to this. Asking for something to calm her down.

## 2014-07-24 NOTE — ED Notes (Signed)
Pt has been accepted at [facility]. [facility] notified of need for pt transportation.

## 2014-07-24 NOTE — ED Provider Notes (Signed)
Pt now does not wish to go to [facility], will d/c home  Kalman Drape, MD 07/24/14 [phone]

## 2014-07-24 NOTE — BH Assessment (Signed)
[place] Assessment Progress Note  Patsy from [facility] was contacted and she said that if we can get patient to [facility] that they will take her.  Clinician spoke with Nunzio Cobbs, RN and let her know that patient can go to [facility] if [facility] can transport.  She will get in touch with [facility]. Clinician also called Florene Glen, NP and let her know that patient had been accepted to [facility].  Patsy was contacted again.  She said that patient had contacted [facility] earlier in the day and had asked about whether a boyfriend could come over.  They told her that both of them could not be accepted for the same stay.  Clinician asked Nunzio Cobbs if she would let patient know before [facility] transports her in case patient changes mind.

## 2014-07-24 NOTE — Discharge Instructions (Signed)
Drug Abuse and Addiction in Sports There are many types of drugs that one may become addicted to including illegal drugs (marijuana, cocaine, amphetamines, hallucinogens, and narcotics), prescription drugs (hydrocodone, codeine, and alprazolam), and other chemicals such as alcohol or nicotine. Two types of addiction exist: physical and emotional. Physical addiction usually occurs after prolonged use of a drug. However, some drugs may only take a couple uses before addiction can occur. Physical addiction is marked by withdrawal symptoms in which the person experiences negative symptoms such as sweat, anxiety, tremors, hallucinations, or cravings in the absence of using the drug. Emotional dependence is the psychological desire for the "high" that the drugs produce when taken. SYMPTOMS   Inattentiveness.  Negligence.  Forgetfulness.  Insomnia.  Mood swings. RISK INCREASES WITH:   Family history of addiction.  Personal history of addictive personality. Studies have shown that risk takers, which many athletes are, have a higher risk of addiction. PREVENTION The only adequate prevention of drug abuse is abstinence from drugs. TREATMENT  The first step in quitting substance abuse is recognizing the problem and realizing that one has the power to change. Quitting requires a plan and support from others. It is often necessary to seek medical assistance. Caregivers are available to offer counseling, and for certain cases, medicine to diminish the physical symptoms of withdrawal. Many organizations exist such as Alcoholics Anonymous, Narcotics Anonymous, or the [employer] on Alcoholism that offer support for individuals who have chosen to quit their habits. Document Released: 06/19/2005 Document Revised: 11/03/2013 Document Reviewed: 10/01/2008 [facility] Patient Information 2015 [place], [place]. This information is not intended to replace advice given to you by your health care provider. Make  sure you discuss any questions you have with your health care provider. Go directly to [facility]

## 2014-07-24 NOTE — ED Provider Notes (Signed)
Patient has been accepted to [facility] will DC and have staff arrange transport   Garald Balding, NP 07/24/14 0136  Garald Balding, NP 07/27/14 [place], MD 07/30/14 1807

## 2015-02-03 ENCOUNTER — Encounter (HOSPITAL_COMMUNITY): Payer: Self-pay | Admitting: Emergency Medicine

## 2015-02-03 ENCOUNTER — Emergency Department (HOSPITAL_COMMUNITY)
Admission: EM | Admit: 2015-02-03 | Discharge: 2015-02-03 | Disposition: A | Discharge status: Home / Self Care | Payer: No Typology Code available for payment source | Attending: Emergency Medicine | Admitting: Emergency Medicine

## 2015-02-03 ENCOUNTER — Emergency Department (HOSPITAL_COMMUNITY): Payer: No Typology Code available for payment source

## 2015-02-03 DIAGNOSIS — S0181XA Laceration without foreign body of other part of head, initial encounter: Secondary | ICD-10-CM | POA: Diagnosis not present

## 2015-02-03 DIAGNOSIS — S32009A Unspecified fracture of unspecified lumbar vertebra, initial encounter for closed fracture: Secondary | ICD-10-CM

## 2015-02-03 DIAGNOSIS — S32018A Other fracture of first lumbar vertebra, initial encounter for closed fracture: Secondary | ICD-10-CM | POA: Diagnosis not present

## 2015-02-03 DIAGNOSIS — Z720 Tobacco use: Secondary | ICD-10-CM | POA: Diagnosis not present

## 2015-02-03 DIAGNOSIS — Y9389 Activity, other specified: Secondary | ICD-10-CM | POA: Insufficient documentation

## 2015-02-03 DIAGNOSIS — Z3202 Encounter for pregnancy test, result negative: Secondary | ICD-10-CM | POA: Insufficient documentation

## 2015-02-03 DIAGNOSIS — Z79899 Other long term (current) drug therapy: Secondary | ICD-10-CM | POA: Diagnosis not present

## 2015-02-03 DIAGNOSIS — S32048A Other fracture of fourth lumbar vertebra, initial encounter for closed fracture: Secondary | ICD-10-CM | POA: Insufficient documentation

## 2015-02-03 DIAGNOSIS — Y998 Other external cause status: Secondary | ICD-10-CM | POA: Diagnosis not present

## 2015-02-03 DIAGNOSIS — K219 Gastro-esophageal reflux disease without esophagitis: Secondary | ICD-10-CM | POA: Diagnosis not present

## 2015-02-03 DIAGNOSIS — S8012XA Contusion of left lower leg, initial encounter: Secondary | ICD-10-CM | POA: Diagnosis not present

## 2015-02-03 DIAGNOSIS — Y92410 Unspecified street and highway as the place of occurrence of the external cause: Secondary | ICD-10-CM | POA: Insufficient documentation

## 2015-02-03 DIAGNOSIS — S3992XA Unspecified injury of lower back, initial encounter: Secondary | ICD-10-CM | POA: Insufficient documentation

## 2015-02-03 DIAGNOSIS — S0990XA Unspecified injury of head, initial encounter: Secondary | ICD-10-CM | POA: Insufficient documentation

## 2015-02-03 DIAGNOSIS — F419 Anxiety disorder, unspecified: Secondary | ICD-10-CM | POA: Diagnosis not present

## 2015-02-03 DIAGNOSIS — S29001A Unspecified injury of muscle and tendon of front wall of thorax, initial encounter: Secondary | ICD-10-CM | POA: Diagnosis present

## 2015-02-03 LAB — COMPREHENSIVE METABOLIC PANEL
ALK PHOS: 121 U/L (ref 38–126)
ALT: 35 U/L (ref 14–54)
AST: 47 U/L — AB (ref 15–41)
Albumin: 4.3 g/dL (ref 3.5–5.0)
Anion gap: 13 (ref 5–15)
BUN: 8 mg/dL (ref 6–20)
CHLORIDE: 105 mmol/L (ref 101–111)
CO2: 23 mmol/L (ref 22–32)
Calcium: 9.5 mg/dL (ref 8.9–10.3)
Creatinine, Ser: 0.64 mg/dL (ref 0.44–1.00)
GFR calc Af Amer: >60 mL/min (ref >60)
GFR calc non Af Amer: >60 mL/min (ref >60)
GLUCOSE: 126 mg/dL — AB (ref 65–99)
Potassium: 3.5 mmol/L (ref 3.5–5.1)
SODIUM: 141 mmol/L (ref 135–145)
Total Bilirubin: 0.5 mg/dL (ref 0.3–1.2)
Total Protein: 7.6 g/dL (ref 6.5–8.1)

## 2015-02-03 LAB — SAMPLE TO BLOOD BANK

## 2015-02-03 LAB — CBC
HEMATOCRIT: 44.3 % (ref 36.0–46.0)
Hemoglobin: 15.6 g/dL — ABNORMAL HIGH (ref 12.0–15.0)
MCH: 30.8 pg (ref 26.0–34.0)
MCHC: 35.2 g/dL (ref 30.0–36.0)
MCV: 87.5 fL (ref 78.0–100.0)
PLATELETS: 200 10*3/uL (ref 150–400)
RBC: 5.06 MIL/uL (ref 3.87–5.11)
RDW: 13.3 % (ref 11.5–15.5)
WBC: 15.8 10*3/uL — ABNORMAL HIGH (ref 4.0–10.5)

## 2015-02-03 LAB — PROTIME-INR
INR: 0.99 (ref 0.00–1.49)
Prothrombin Time: 13.3 seconds (ref 11.6–15.2)

## 2015-02-03 LAB — CDS SEROLOGY

## 2015-02-03 LAB — I-STAT BETA HCG BLOOD, ED (MC, WL, AP ONLY): I-stat hCG, quantitative: <5 m[IU]/mL (ref <5)

## 2015-02-03 LAB — ETHANOL: Alcohol, Ethyl (B): <5 mg/dL (ref <5)

## 2015-02-03 MED ORDER — NAPROXEN 500 MG PO TABS: 500.0000 mg | ORAL_TABLET | Freq: Two times a day (BID) | ORAL | Status: DC

## 2015-02-03 MED ORDER — HYDROCODONE-ACETAMINOPHEN 5-325 MG PO TABS
1.0000 | ORAL_TABLET | Freq: Once | ORAL | Status: AC
  Administered 2015-02-03: 1 via ORAL
  Filled 2015-02-03: qty 1

## 2015-02-03 MED ORDER — SODIUM CHLORIDE 0.9 % IV BOLUS (SEPSIS)
500.0000 mL | Freq: Once | INTRAVENOUS | Status: AC
  Administered 2015-02-03: 500 mL via INTRAVENOUS

## 2015-02-03 MED ORDER — IOHEXOL 300 MG/ML  SOLN
100.0000 mL | Freq: Once | INTRAMUSCULAR | Status: AC | PRN
  Administered 2015-02-03: 100 mL via INTRAVENOUS

## 2015-02-03 MED ORDER — MORPHINE SULFATE 4 MG/ML IJ SOLN
6.0000 mg | Freq: Once | INTRAMUSCULAR | Status: AC
  Administered 2015-02-03: 6 mg via INTRAVENOUS
  Filled 2015-02-03: qty 2

## 2015-02-03 MED ORDER — MORPHINE SULFATE 4 MG/ML IJ SOLN: 4.0000 mg | Freq: Once | INTRAMUSCULAR | Status: DC

## 2015-02-03 MED ORDER — HYDROCODONE-ACETAMINOPHEN 5-325 MG PO TABS: 2.0000 | ORAL_TABLET | ORAL | Status: DC | PRN

## 2015-02-03 MED ORDER — NICOTINE 21 MG/24HR TD PT24
21.0000 mg | MEDICATED_PATCH | Freq: Every day | TRANSDERMAL | Status: DC
  Administered 2015-02-03: 21 mg via TRANSDERMAL
  Filled 2015-02-03: qty 1

## 2015-02-03 MED ORDER — CYCLOBENZAPRINE HCL 10 MG PO TABS: 10.0000 mg | ORAL_TABLET | Freq: Two times a day (BID) | ORAL | Status: DC | PRN

## 2015-02-03 MED ORDER — HYDROMORPHONE HCL 1 MG/ML IJ SOLN
1.0000 mg | Freq: Once | INTRAMUSCULAR | Status: AC
  Administered 2015-02-03: 1 mg via INTRAVENOUS
  Filled 2015-02-03: qty 1

## 2015-02-03 NOTE — ED Notes (Signed)
TLSO brace has been ordered by [facility] and they stated that it would be delivered in the next hour to hour and half. Pt has been informed. Xray was informed that xray of lumbar spine will be done after brace has been placed on pt. Xray will be called when ready for xray.
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
## 2015-02-03 NOTE — ED Notes (Signed)
Pt. Asking for pain medicine and "zofran" stating shes very uncomfortable.. Nurse notified

## 2015-02-03 NOTE — Discharge Instructions (Signed)
Lumbar Fracture °A fracture of a bone is the same as a break in the bone. A fracture in the lumbar area is a break that involves one of many parts that make up the 5 bones of the low back area. This is just above the pelvis.  °CAUSES °Most of these injuries occur as a result of an accident such as: °· A fall. °· A car accident. °· Recreational activities. °· A smaller number occur due to: °¨ Industrial, farm, and aviation accidents. °¨ Gunshot wounds and direct blows to the back. °¨ Parachuting incidents. °Most lumbar fractures affect the "building blocks" or the main portion of the spine known as the "vertebral bodies" (see the image on the right). A smaller number involve breaks to portions of bone that extend to the sides or backward behind the vertebral body. In the elderly, a sudden break can happen without an apparent cause. This is because the bones of the back have become extremely thin and fragile. This condition is known as osteoporosis. °SYMPTOMS °Patients with lumbar fractures have severe pain even if the actual break is small or limited, and there is no injury to nearby nerves. More severe or complex injuries involving other bones and/or organs may include:  °· Deformity of the back bones. °· Swelling/bruising over the injured area. °· Limited ability to move the affected area. °· Partial or complete loss of function of the bladder and/or bowels. (This may be due to injury to nearby nerves). °· More severe injuries can also cause: °¨ Loss of sensation and/or strength in the legs, feet, and toes. °¨ Paralysis. °DIAGNOSIS °In most cases, a lumbar fracture will be suspected by what happened just prior to the onset of back pain. X-rays and special imaging (CT scan and MRI imaging) are used to confirm the diagnosis as well as finding out the type and severity of the break or breaks. These tests guide treatment. But there are times when special imaging cannot be done. For example, MRI cannot be done if there  is an implanted metallic device (such as a pacemaker). In these cases, other tests and imaging are done. °If there has been nerve damage, more tests can be done. These include: °· Tests of nerve function through muscles (nerve conduction studies and electromyography). °· Tests of bladder function (urodynamics). °· Tests that focus on defining specific nerve problems before surgery and what improvement has come about after surgery (evoked potentials). °TREATMENT °Common injuries may involve a small break off of the main surface of the back bone. Or they may be in the form of a partial flattening or compression of the bone. Hospital care may not be needed for these. Medicine for pain control, special back bracing, and limitations in activity are done first. Physical therapy follows later. °Complex breaks, multiple fractures of the spine, or unstable injuries can damage the spinal cord. They may require an operation to remove pressure from the nerves and/or spinal cord and to stabilize the broken pieces of bone. Each individual set of injuries is unique. The surgeon will take into consideration many things when planning the best surgical approach that will give the highest likelihood of a good outcome.  °HOME CARE INSTRUCTIONS °There is pain and stiffness in the back for weeks after a vertebral fracture. Bed rest, pain medicine, and a slow return to activity are generally recommended. Neck and back braces may be helpful in reducing pain and increasing mobility. When your pain allows, simple walking will help to begin the   process of returning to normal activities. Exercises to improve motion and to strengthen the back may also be useful after the initial pain goes away. This will be guided by your caregiver and the team (nurses, physical therapists, occupational therapists, etc.) involved with your ongoing care. For the elderly, treatment for osteoporosis may be needed to help reduce the risk of fractures in the  future. Arrange for follow-up care as recommended to assure proper long-term care and prevention of further spine injury. The failure to follow-up as recommended could result in permanent injury, disability, and a chronic painful condition. SEEK MEDICAL CARE IF:  Pain is not effectively controlled with medication.  You feel unable to decrease pain medication over time as planned.  Activity level is not improving as planned and/or expected. SEEK IMMEDIATE MEDICAL CARE IF:  You have increasing pain, vomiting, or are unable to move around at all.  You have numbness, tingling, weakness, or paralysis of any part of your body.  You have loss of normal bowel or bladder control.  You have difficulty breathing, cough, fever, chest or abdominal pain. Document Released: 10/04/2006 Document Revised: 09/11/2011 Document Reviewed: 06/04/2007 [facility] Patient Information 2015 [place], [place]. This information is not intended to replace advice given to you by your health care provider. Make sure you discuss any questions you have with your health care provider.   Emergency Department Resource Guide 1) Find a Doctor and Pay Out of Pocket Although you won't have to find out who is covered by your insurance plan, it is a good idea to ask around and get recommendations. You will then need to call the office and see if the doctor you have chosen will accept you as a new patient and what types of options they offer for patients who are self-pay. Some doctors offer discounts or will set up payment plans for their patients who do not have insurance, but you will need to ask so you aren't surprised when you get to your appointment.  2) Contact Your Local Health Department Not all health departments have doctors that can see patients for sick visits, but many do, so it is worth a call to see if yours does. If you don't know where your local health department is, you can check in your phone book. The CDC also has a  tool to help you locate your state's health department, and many state websites also have listings of all of their local health departments.  3) Find a [facility] If your illness is not likely to be very severe or complicated, you may want to try a walk in clinic. These are popping up all over the country in pharmacies, drugstores, and shopping centers. They're usually staffed by nurse practitioners or physician assistants that have been trained to treat common illnesses and complaints. They're usually fairly quick and inexpensive. However, if you have serious medical issues or chronic medical problems, these are probably not your best option.  No Primary Care Doctor: - Call Health Connect at  [phone] - they can help you locate a primary care doctor that  accepts your insurance, provides certain services, etc. - Physician Referral Service- [phone]  Chronic Pain Problems: Organization         Address  Phone   Notes  [facility]  [phone] Patients need to be referred by their primary care doctor.   Medication Assistance: Organization         Address  Phone   Notes  [place]  [facility] Medication Assistance Program [address] [place], [place] [address] [phone] --Must be a resident of [facility] -- Must have NO insurance coverage whatsoever (no Medicaid/ Medicare, etc.) -- The pt. MUST have a primary care doctor that directs their care regularly and follows them in the community   [facility]ist  [phone]   [employer]  [phone]    Agencies that provide inexpensive medical care: Organization         Address  Phone   Notes  [place]  [phone]   [facility]    [phone]   [facility] [place], [place] [address] [phone]   [place] [address] [place] [phone]   Planned Parenthood    [phone]    [facility]    [phone]   Between and [address], [place] Phone:  [phone], Fax:  [phone] Hours of Operation:  9 am - 6 pm, M-F.  Also accepts Medicaid/Medicare and self-pay.  [facility] for [place], [address], [place] Phone: [phone], Fax: [phone]. Hours of Operation:  8:30 am - 5:30 pm, M-F.  Also accepts Medicaid and self-pay.  [facility] [address] [place] Phone: [phone]   [place], [place], [place] [phone], Ext. [phone] Mondays & Thursdays: 7-9 AM.  First 15 patients are seen on a first come, first serve basis.    [place] Providers:  Organization         Address  Phone   Notes  [facility] [address], [place] [phone] Also accepts self-pay patients.  [facility] [address], [place]  [phone]   [place], [address], [place] [phone]   [facility] [address], [place] [phone]   Lucianne Lei [address], [place]   [phone] Only accepts [place] Access [place] patients after they have their name applied to their card.   Self-Pay (no insurance) in [facility]:  Organization         Address  Phone   Notes  Sickle Cell Patients, [facility] [place] [phone]   [facility] [place] [phone]   [facility] [place]  [place], [place], [place] [phone]   [facility]/Dr. Osei-Bonsu  [address] [place] or [address], [place] [phone] Phone number for both [place] and [place] locations is the same.  [facility] [address], [place] [phone]   [facility] [address], [place] or [address] [phone]   [facility] [address] [place] [phone], phone; [phone], fax Sees patients 1st and 3rd Saturday of every month.  Must not qualify for public or private insurance (i.e. Medicaid, Medicare, [place] Health Choice, Veterans' Benefits)  Household income should be no more than 200% of the poverty level The clinic cannot treat you if you are pregnant or think you are pregnant  Sexually transmitted diseases are not  treated at the clinic.    Dental Care: Organization         Address  Phone  Notes  [facility] Department of [facility] [place] [phone] Accepts children up to age 32 who are enrolled in [place] or [place]; pregnant women with a Medicaid card; and children who have applied for Medicaid or [place] Health Choice, but were declined, whose parents can pay a reduced fee at time of service.  [facility] Department of [facility]  [address], [place] [phone] Accepts children up to age 17 who are enrolled in [place] or [place]; pregnant women with a Medicaid card; and children who have applied for Medicaid or [place] Health Choice, but were declined, whose parents can pay a reduced fee at time of service.  [facility]  [place] [phone] Patients are seen by appointment only. Walk-ins are not accepted. [place] will see patients 31 years of age and older. Monday - Tuesday (8am-5pm) Most Wednesdays (8:30-5pm) $30 per visit, cash only  [facility] Adult [facility] Access PROGRAM  [address], [facility] [phone] Patients are seen by appointment only. Walk-ins are not accepted. [place] will see patients 67 years of age and older. One Wednesday  Evening (Monthly: Volunteer Based).  $30 per visit, cash only  [place]  [phone] for adults; Children under age 75, call Graduate Pediatric Dentistry at [phone]. Children aged 70-14, please call [phone] to request a pediatric application.  Dental services are provided in all areas of dental care including fillings, crowns and bridges, complete and partial dentures, implants, gum treatment, root canals, and extractions. Preventive care is also provided. Treatment is provided to both adults and children. Patients are selected via a lottery and there is often a waiting list.   [facility] [address] [place]  [phone] [email or web address]   [facility] [address] [place], [place] [phone], Ext. [phone] Second and Fourth Thursday of each month, opens at 6:30 AM; Clinic ends at 9 AM.  Patients are seen on a first-come first-served basis, and a limited number are seen during each clinic.   [facility]  [address] Hillard Danker [place], [place] [phone]   Eligibility Requirements You must have lived in [place], [place], or [place] counties for at least the last three months.   You cannot be eligible for state or federal sponsored [employer], including [employer], [place], or [employer].   You generally cannot be eligible for healthcare insurance through your employer.    How to apply: Eligibility screenings are held every Tuesday and Wednesday afternoon from 1:00 pm until 4:00 pm. You do not need an appointment for the interview!  [facility] [address] [place], [place]   [place]  [place] Department  [place]  [phone]    Behavioral Health Resources in the Community: Intensive Outpatient Programs Organization         Address  Phone  Notes  [place] [facility]. [address] [place], [place] [phone]   [facility] [address] [place], [place]   ADS: [facility] [address], [place], [place]   [place]  [place] [address]  [place], [place] or [phone]   Substance Abuse Resources Organization         Address  Phone  Notes  Alcohol and Drug Services  [phone]   [place]  [phone]   [facility] [place]   Chinita Pester  [phone]   Residential & Outpatient Substance Abuse Program  [phone]   Psychological Services Organization         Address  Phone  Notes  [facility]  [place]  [phone]   [place] [address], [place] or [phone]    Mobile Crisis Teams Organization         Address  Phone  Notes  [facility], Mobile Crisis Care Unit  [phone]   [facility]  [address] [place], [place]   [place] Levels [address] [place] [phone]    Self-Help/Support Groups Organization         Address  Phone             Notes  [place]. of [place] - variety of support groups  [place] Call for more information  Narcotics Anonymous (NA), [facility] [address], [employer]   2 meetings at this location   Special educational needs teacher         Address  Phone  Notes  [facility] [place],    [place]  [phone]   [facility]  [address], [place] [address], [employer], [place]   [place], [place] [phone] Admissions: 8am-3pm M-F  Incentives Substance [place] [address]    [place], [place] [phone]   [facility] [address] [place],  [place], [place]   [facility] [address],  [place], [place]   [facility] - Intensive Outpatient [address] Kristeen Mans [address], [place], [place]   [facility] [address].,  [place], [place] [phone] or [phone]   [facility] (RTS) [address] [place], [place] Accepts Medicaid  [facility] [address],  [place] [phone] Substance Abuse/Addiction Treatment   [facility] Organization         Address  Phone  Notes  [facility]  [phone]   Domenic Schwab, PhD [address] Arlis Porta [place], [place]   [phone] or [phone]   V[place]ia Viola [place], [place] [phone]   [facility] [address], [place], [place] [phone] Insurance/Medicaid/sponsorship through [facility] and Families [address] [place]                                    [place], [place] [phone] [place] [address], [place] [phone]    Dr. Adele Schilder  [phone]   [facility] Dept. 1) [address], [place] 2) [place] 3)  [place] [address], [place] [phone]  [phone]   [place] [phone] or [phone] (After Hours)

## 2015-02-03 NOTE — ED Notes (Signed)
Pt stable, ambulatory, states understanding of discharge instructions 
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
## 2015-02-03 NOTE — Progress Notes (Signed)
Orthopedic Tech Progress Note Patient Details:  [name] Jul 17, 1975 [id number] Brace order completed by [employer] vendor. Patient ID: [name], female   DOB: Mar 29, 1976, 39 y.o.   MRN: [id number]   [name] 02/03/2015, 6:50 PM

## 2015-02-03 NOTE — ED Notes (Signed)
Pt keeps taking c-collar off and refusing to keep c- collar on.

## 2015-02-03 NOTE — Progress Notes (Addendum)
Orthopedic Tech Progress Note Patient Details:  [name] Jan 05, 1976 [id number] Called [employer] for brace Patient ID: Harold Hedge, female   DOB: July 11, 1975, 39 y.o.   MRN: [id number]   [name] 02/03/2015, 6:39 PM

## 2015-07-27 ENCOUNTER — Emergency Department (HOSPITAL_COMMUNITY)
Admission: EM | Admit: 2015-07-27 | Discharge: 2015-07-27 | Disposition: A | Discharge status: Home / Self Care | Payer: Medicaid Other | Attending: Emergency Medicine | Admitting: Emergency Medicine

## 2015-07-27 ENCOUNTER — Emergency Department (HOSPITAL_COMMUNITY): Payer: Medicaid Other

## 2015-07-27 ENCOUNTER — Encounter (HOSPITAL_COMMUNITY): Payer: Self-pay | Admitting: *Deleted

## 2015-07-27 DIAGNOSIS — F17200 Nicotine dependence, unspecified, uncomplicated: Secondary | ICD-10-CM | POA: Insufficient documentation

## 2015-07-27 DIAGNOSIS — Z3202 Encounter for pregnancy test, result negative: Secondary | ICD-10-CM | POA: Insufficient documentation

## 2015-07-27 DIAGNOSIS — Y92009 Unspecified place in unspecified non-institutional (private) residence as the place of occurrence of the external cause: Secondary | ICD-10-CM | POA: Insufficient documentation

## 2015-07-27 DIAGNOSIS — F419 Anxiety disorder, unspecified: Secondary | ICD-10-CM | POA: Insufficient documentation

## 2015-07-27 DIAGNOSIS — S0081XA Abrasion of other part of head, initial encounter: Secondary | ICD-10-CM

## 2015-07-27 DIAGNOSIS — Y998 Other external cause status: Secondary | ICD-10-CM | POA: Insufficient documentation

## 2015-07-27 DIAGNOSIS — Y9389 Activity, other specified: Secondary | ICD-10-CM | POA: Insufficient documentation

## 2015-07-27 DIAGNOSIS — K219 Gastro-esophageal reflux disease without esophagitis: Secondary | ICD-10-CM | POA: Insufficient documentation

## 2015-07-27 DIAGNOSIS — Z791 Long term (current) use of non-steroidal anti-inflammatories (NSAID): Secondary | ICD-10-CM | POA: Insufficient documentation

## 2015-07-27 DIAGNOSIS — W19XXXA Unspecified fall, initial encounter: Secondary | ICD-10-CM

## 2015-07-27 DIAGNOSIS — Z8541 Personal history of malignant neoplasm of cervix uteri: Secondary | ICD-10-CM | POA: Insufficient documentation

## 2015-07-27 DIAGNOSIS — Z79899 Other long term (current) drug therapy: Secondary | ICD-10-CM | POA: Insufficient documentation

## 2015-07-27 DIAGNOSIS — W01190A Fall on same level from slipping, tripping and stumbling with subsequent striking against furniture, initial encounter: Secondary | ICD-10-CM | POA: Insufficient documentation

## 2015-07-27 DIAGNOSIS — S0101XA Laceration without foreign body of scalp, initial encounter: Secondary | ICD-10-CM

## 2015-07-27 LAB — PREGNANCY, URINE: Preg Test, Ur: NEGATIVE

## 2015-07-27 MED ORDER — MECLIZINE HCL 25 MG PO TABS: 25.0000 mg | ORAL_TABLET | Freq: Four times a day (QID) | ORAL | Status: DC | PRN

## 2015-07-27 MED ORDER — ACETAMINOPHEN 500 MG PO TABS
1000.0000 mg | ORAL_TABLET | Freq: Once | ORAL | Status: AC
  Administered 2015-07-27: 1000 mg via ORAL
  Filled 2015-07-27: qty 2

## 2015-07-27 MED ORDER — BACITRACIN ZINC 500 UNIT/GM EX OINT
TOPICAL_OINTMENT | CUTANEOUS | Status: AC
  Administered 2015-07-27: 2
  Filled 2015-07-27: qty 1.8

## 2015-07-27 MED ORDER — MECLIZINE HCL 12.5 MG PO TABS
25.0000 mg | ORAL_TABLET | Freq: Once | ORAL | Status: AC
  Administered 2015-07-27: 25 mg via ORAL
  Filled 2015-07-27: qty 2

## 2015-07-27 NOTE — ED Notes (Signed)
Dried blood cleaned from head lac,  Patient ambulatory to restroom with steady gait, clean catch instructions given and advised pt to bring specimen back to room as well.
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
## 2015-07-27 NOTE — ED Notes (Signed)
Vitals within normal limits. Asked patient if she was having pain, patient drifted off to sleep in mid sentence. Had to wake her. Patient very sleepy, speech slurred and incoherent. Patient states that this is past her bed time is the reason she can not stay woke.

## 2015-07-27 NOTE — Discharge Instructions (Signed)
Ice packs to the painful and swollen areas. Take acetaminophen 1000 mg 3-4 times a day for pain as needed. Try the meclizine for the feeling of being off balance. Keep the abrasion and laceration clean and dry. Do use triple antibiotic ointment on those areas to prevent infection.  Recheck for any problems listed on the head injury sheet. You may end up developing a black eye.    Head Injury, Adult You have a head injury. Headaches and throwing up (vomiting) are common after a head injury. It should be easy to wake up from sleeping. Sometimes you must stay in the hospital. Most problems happen within the first 24 hours. Side effects may occur up to 7-10 days after the injury.  WHAT ARE THE TYPES OF HEAD INJURIES? Head injuries can be as minor as a bump. Some head injuries can be more severe. More severe head injuries include:  A jarring injury to the brain (concussion).  A bruise of the brain (contusion). This mean there is bleeding in the brain that can cause swelling.  A cracked skull (skull fracture).  Bleeding in the brain that collects, clots, and forms a bump (hematoma). WHEN SHOULD I GET HELP RIGHT AWAY?   You are confused or sleepy.  You cannot be woken up.  You feel sick to your stomach (nauseous) or keep throwing up (vomiting).  Your dizziness or unsteadiness is getting worse.  You have very bad, lasting headaches that are not helped by medicine. Take medicines only as told by your doctor.  You cannot use your arms or legs like normal.  You cannot walk.  You notice changes in the black spots in the center of the colored part of your eye (pupil).  You have clear or bloody fluid coming from your nose or ears.  You have trouble seeing. During the next 24 hours after the injury, you must stay with someone who can watch you. This person should get help right away (call 911 in the [place] if you start to shake and are not able to control it (have seizures), you pass out, or you  are unable to wake up. HOW CAN I PREVENT A HEAD INJURY IN THE FUTURE?  Wear seat belts.  Wear a helmet while bike riding and playing sports like football.  Stay away from dangerous activities around the house. WHEN CAN I RETURN TO NORMAL ACTIVITIES AND ATHLETICS? See your doctor before doing these activities. You should not do normal activities or play contact sports until 1 week after the following symptoms have stopped:  Headache that does not go away.  Dizziness.  Poor attention.  Confusion.  Memory problems.  Sickness to your stomach or throwing up.  Tiredness.  Fussiness.  Bothered by bright lights or loud noises.  Anxiousness or depression.  Restless sleep. MAKE SURE YOU:   Understand these instructions.  Will watch your condition.  Will get help right away if you are not doing well or get worse.   This information is not intended to replace advice given to you by your health care provider. Make sure you discuss any questions you have with your health care provider.   Document Released: 06/01/2008 Document Revised: 07/10/2014 Document Reviewed: 02/24/2013 Elsevier Interactive Patient Education 2016 [place] An abrasion is a cut or scrape on the surface of your skin. An abrasion does not go through all of the layers of your skin. It is important to take good care of your abrasion to prevent infection. HOME  CARE Medicines  Take or apply medicines only as told by your doctor.  If you were prescribed an antibiotic ointment, finish all of it even if you start to feel better. Wound Care  Clean the wound with mild soap and water 2-3 times per day or as told by your doctor. Pat your wound dry with a clean towel. Do not rub it.  There are many ways to close and cover a wound. Follow instructions from your doctor about:  How to take care of your wound.  When and how you should change your bandage (dressing).  When and how you should take off  your dressing.  Check your wound every day for signs of infection. Watch for:  Redness, swelling, or pain.  Fluid, blood, or pus. General Instructions  Keep the dressing dry as told by your doctor. Do not take baths, swim, use a hot tub, or do anything that would put your wound underwater until your doctor says it is okay.  If there is swelling, raise (elevate) the injured area above the level of your heart while you are sitting or lying down.  Keep all follow-up visits as told by your doctor. This is important. GET HELP IF:  You were given a tetanus shot and you have any of these where the needle went in:  Swelling.  Very bad pain.  Redness.  Bleeding.  Medicine does not help your pain.  You have any of these at the site of the wound:  More redness.  More swelling.  More pain. GET HELP RIGHT AWAY IF:  You have a red streak going away from your wound.  You have a fever.  You have fluid, blood, or pus coming from your wound.  There is a bad smell coming from your wound.   This information is not intended to replace advice given to you by your health care provider. Make sure you discuss any questions you have with your health care provider.   Document Released: 12/06/2007 Document Revised: 11/03/2014 Document Reviewed: 06/17/2014 Elsevier Interactive Patient Education [employer].
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
## 2016-01-23 ENCOUNTER — Encounter (HOSPITAL_COMMUNITY): Payer: Self-pay | Admitting: Emergency Medicine

## 2016-01-23 ENCOUNTER — Emergency Department (HOSPITAL_COMMUNITY)
Admission: EM | Admit: 2016-01-23 | Discharge: 2016-01-23 | Disposition: A | Discharge status: Home / Self Care | Payer: Medicaid Other | Attending: Emergency Medicine | Admitting: Emergency Medicine

## 2016-01-23 DIAGNOSIS — E876 Hypokalemia: Secondary | ICD-10-CM | POA: Insufficient documentation

## 2016-01-23 DIAGNOSIS — F17200 Nicotine dependence, unspecified, uncomplicated: Secondary | ICD-10-CM | POA: Insufficient documentation

## 2016-01-23 DIAGNOSIS — Z79899 Other long term (current) drug therapy: Secondary | ICD-10-CM | POA: Insufficient documentation

## 2016-01-23 DIAGNOSIS — F1123 Opioid dependence with withdrawal: Secondary | ICD-10-CM | POA: Insufficient documentation

## 2016-01-23 DIAGNOSIS — F1193 Opioid use, unspecified with withdrawal: Secondary | ICD-10-CM

## 2016-01-23 LAB — URINALYSIS, ROUTINE W REFLEX MICROSCOPIC
GLUCOSE, UA: NEGATIVE mg/dL
HGB URINE DIPSTICK: NEGATIVE
Leukocytes, UA: NEGATIVE
Nitrite: NEGATIVE
PH: 6 (ref 5.0–8.0)
PROTEIN: 30 mg/dL — AB
Specific Gravity, Urine: >1.03 — ABNORMAL HIGH (ref 1.005–1.030)

## 2016-01-23 LAB — URINE MICROSCOPIC-ADD ON

## 2016-01-23 LAB — COMPREHENSIVE METABOLIC PANEL
ALBUMIN: 5 g/dL (ref 3.5–5.0)
ALK PHOS: 122 U/L (ref 38–126)
ALT: 27 U/L (ref 14–54)
AST: 25 U/L (ref 15–41)
Anion gap: 11 (ref 5–15)
BUN: 19 mg/dL (ref 6–20)
CALCIUM: 9.6 mg/dL (ref 8.9–10.3)
CO2: 28 mmol/L (ref 22–32)
Chloride: 99 mmol/L — ABNORMAL LOW (ref 101–111)
Creatinine, Ser: 0.59 mg/dL (ref 0.44–1.00)
GFR calc non Af Amer: >60 mL/min (ref >60)
GLUCOSE: 112 mg/dL — AB (ref 65–99)
POTASSIUM: 2.9 mmol/L — AB (ref 3.5–5.1)
SODIUM: 138 mmol/L (ref 135–145)
TOTAL PROTEIN: 8.9 g/dL — AB (ref 6.5–8.1)
Total Bilirubin: 0.9 mg/dL (ref 0.3–1.2)

## 2016-01-23 LAB — CBC WITH DIFFERENTIAL/PLATELET
BASOS PCT: 0 %
Basophils Absolute: 0 10*3/uL (ref 0.0–0.1)
EOS ABS: 0 10*3/uL (ref 0.0–0.7)
Eosinophils Relative: 0 %
HEMATOCRIT: 46.7 % — AB (ref 36.0–46.0)
HEMOGLOBIN: 16.9 g/dL — AB (ref 12.0–15.0)
LYMPHS ABS: 2.6 10*3/uL (ref 0.7–4.0)
Lymphocytes Relative: 27 %
MCH: 30.2 pg (ref 26.0–34.0)
MCHC: 36.2 g/dL — ABNORMAL HIGH (ref 30.0–36.0)
MCV: 83.4 fL (ref 78.0–100.0)
Monocytes Absolute: 0.6 10*3/uL (ref 0.1–1.0)
Monocytes Relative: 6 %
NEUTROS ABS: 6.5 10*3/uL (ref 1.7–7.7)
NEUTROS PCT: 67 %
Platelets: 300 10*3/uL (ref 150–400)
RBC: 5.6 MIL/uL — AB (ref 3.87–5.11)
RDW: 13.1 % (ref 11.5–15.5)
WBC: 9.6 10*3/uL (ref 4.0–10.5)

## 2016-01-23 LAB — RAPID URINE DRUG SCREEN, HOSP PERFORMED
AMPHETAMINES: NOT DETECTED
BARBITURATES: NOT DETECTED
Benzodiazepines: POSITIVE — AB
COCAINE: POSITIVE — AB
OPIATES: POSITIVE — AB
TETRAHYDROCANNABINOL: NOT DETECTED

## 2016-01-23 LAB — ETHANOL: Alcohol, Ethyl (B): <5 mg/dL (ref <5)

## 2016-01-23 LAB — PREGNANCY, URINE: Preg Test, Ur: NEGATIVE

## 2016-01-23 LAB — LIPASE, BLOOD: Lipase: 25 U/L (ref 11–51)

## 2016-01-23 MED ORDER — ONDANSETRON 4 MG PO TBDP
4.0000 mg | ORAL_TABLET | Freq: Once | ORAL | Status: AC
  Administered 2016-01-23: 4 mg via ORAL
  Filled 2016-01-23: qty 1

## 2016-01-23 MED ORDER — NAPROXEN 250 MG PO TABS: 500.0000 mg | ORAL_TABLET | Freq: Two times a day (BID) | ORAL | Status: DC | PRN

## 2016-01-23 MED ORDER — DICYCLOMINE HCL 20 MG PO TABS: 20.0000 mg | ORAL_TABLET | Freq: Four times a day (QID) | ORAL | 0 refills | Status: DC | PRN

## 2016-01-23 MED ORDER — DICYCLOMINE HCL 20 MG PO TABS
20.0000 mg | ORAL_TABLET | Freq: Four times a day (QID) | ORAL | Status: DC | PRN
  Administered 2016-01-23: 20 mg via ORAL
  Filled 2016-01-23: qty 1

## 2016-01-23 MED ORDER — POTASSIUM CHLORIDE CRYS ER 20 MEQ PO TBCR
20.0000 meq | EXTENDED_RELEASE_TABLET | Freq: Once | ORAL | Status: AC
  Administered 2016-01-23: 20 meq via ORAL
  Filled 2016-01-23: qty 1

## 2016-01-23 MED ORDER — CLONIDINE HCL 0.1 MG PO TABS
0.1000 mg | ORAL_TABLET | Freq: Four times a day (QID) | ORAL | Status: DC
  Administered 2016-01-23: 0.1 mg via ORAL
  Filled 2016-01-23: qty 1

## 2016-01-23 MED ORDER — SODIUM CHLORIDE 0.9 % IV SOLN
INTRAVENOUS | Status: DC
  Administered 2016-01-23: 21:00:00 via INTRAVENOUS

## 2016-01-23 MED ORDER — PROMETHAZINE HCL 25 MG PO TABS: 25.0000 mg | ORAL_TABLET | Freq: Four times a day (QID) | ORAL | 0 refills | Status: DC | PRN

## 2016-01-23 MED ORDER — ONDANSETRON 4 MG PO TBDP
4.0000 mg | ORAL_TABLET | Freq: Four times a day (QID) | ORAL | Status: DC | PRN
  Administered 2016-01-23: 4 mg via ORAL
  Filled 2016-01-23: qty 1

## 2016-01-23 MED ORDER — HYDROXYZINE HCL 25 MG PO TABS: 25.0000 mg | ORAL_TABLET | Freq: Four times a day (QID) | ORAL | Status: DC | PRN

## 2016-01-23 MED ORDER — METHOCARBAMOL 500 MG PO TABS: 500.0000 mg | ORAL_TABLET | Freq: Three times a day (TID) | ORAL | 0 refills | Status: DC | PRN

## 2016-01-23 MED ORDER — POTASSIUM CHLORIDE 10 MEQ/100ML IV SOLN
10.0000 meq | Freq: Once | INTRAVENOUS | Status: AC
  Administered 2016-01-23: 10 meq via INTRAVENOUS
  Filled 2016-01-23: qty 100

## 2016-01-23 MED ORDER — CLONIDINE HCL 0.1 MG PO TABS: 0.1000 mg | ORAL_TABLET | Freq: Three times a day (TID) | ORAL | 0 refills | Status: DC

## 2016-01-23 MED ORDER — CLONIDINE HCL 0.1 MG PO TABS: 0.1000 mg | ORAL_TABLET | ORAL | Status: DC

## 2016-01-23 MED ORDER — IBUPROFEN 400 MG PO TABS: 400.0000 mg | ORAL_TABLET | Freq: Four times a day (QID) | ORAL | 0 refills | Status: DC | PRN

## 2016-01-23 MED ORDER — DICYCLOMINE HCL 10 MG PO CAPS
ORAL_CAPSULE | ORAL | Status: AC
  Filled 2016-01-23: qty 2

## 2016-01-23 MED ORDER — LOPERAMIDE HCL 2 MG PO CAPS: 2.0000 mg | ORAL_CAPSULE | ORAL | Status: DC | PRN

## 2016-01-23 MED ORDER — SODIUM CHLORIDE 0.9 % IV BOLUS (SEPSIS)
1000.0000 mL | Freq: Once | INTRAVENOUS | Status: AC
  Administered 2016-01-23: 1000 mL via INTRAVENOUS

## 2016-01-23 MED ORDER — METHOCARBAMOL 500 MG PO TABS: 500.0000 mg | ORAL_TABLET | Freq: Three times a day (TID) | ORAL | Status: DC | PRN

## 2016-01-23 MED ORDER — CLONIDINE HCL 0.1 MG PO TABS: 0.1000 mg | ORAL_TABLET | Freq: Every day | ORAL | Status: DC

## 2016-01-23 NOTE — Discharge Instructions (Signed)
Substance Abuse Treatment Programs ° °[name] °[name]     °[address]      °[name], [place]                   °[phone]      ° °[name] °[address] °[place], [place] °[phone] ° °[name]     °(Inpatient and [name])     °[address]           °[phone]   ° °[name] °[phone] (Suboxone and Methadone) ° °[address]      °[name], [place] [address]      °[phone]      ° °[address] °[place], [place] °[phone] ° °[name])    °(insurance only) [phone]      °       °[name] (Groups [facility]) °[name], [place] °[phone] ° °   °[facility]     °[address]     °[place], [place]      °[phone]      ° °[name] (for caregivers and family) °[address] °[place], [place] °336-299-4655 ° ° ° ° ° °[facility] °[name]      °[address], [place], [place] [address]  °[phone]      ° °[facility] °[address] [place], [place] [address] °[phone] ° °[name]        °[phone]      ° °[facility] °[phone] ° °[name]             °[address]                                         °[place], [place]                                                °[phone] or [phone]                              ° °[name] °[address] °[name], [address] °[phone] ° °[name]    °[address]      °[place], [place]     °[phone]      ° °[name] °[address] °[place], [place] °[phone] ° °[name]   °[address]     °[name], [place] [address]     °[phone]      °Admissions: 8am-3pm M-F ° °[name] °[address] °[place],  [place] °[phone] ° °[facility] (90 Days)   °[name], [place]      °[phone] or [phone]    ° °[facility]: [facility] °[place], [place] °(Walk in Hours over [place]ody weekend or by referral) ° °[name] °[address], [place], [name] [address] °[phone] ° °[name]: [facility]:  1-877-626-1772 (for [place] response 24 hours a day) °[name] Hotline:      1-800-256-2452 °[name] ° °[facility]: [name]   Management 24 hours:  1-877-626-1772 ° °[name] sliding scale fee and walk in schedule: M-F 8am-12pm/1pm-3pm °[address]  °[name], [place] [address] °[phone] ° °[name] °[address] °[place], [place] [address] °[phone] ° °[name] (Formerly known [name] new patient walk-in appointments available Monday - Friday 8am -3pm.          °[address] °[place], [place] [address] °[phone] or Melody line- [phone] ° °[name] °[address] °[place], [place] [address] °[phone] ° °[name]                  °[name]      °[phone]      °[address]     °[place], [place] [address]                ° °[name]   °[name] °[phone] °[address] °[name], [place] [address] ° ° °[name]          °[address],  °[place], [place] [address]       °[phone] ° °[facility] °[address] °[place], [place] [address] °336-292-1510 ° °[name]    °[address]    °[place], [place] [address]     °[phone]      ° °[name]     °[address]     °[place] [address]     °[phone]     °  °[name] °[address] °[place] [address] ° °[name]     °[address]     °[place], [place]      °[phone]      ° °[name] °[name] °[address] °[place], [place] [address] °[phone] ° °[name]     °[address]     °[place], [place] [address]     °[phone]      ° °[facility] °[address] °[place], [place] [address] °[phone] °[name] ° °[facility] [name] °[name] °M-F 8am-3pm   °[address] [place], [place] [address]   [phone] °[name]: laundry, barbering, support groups, case management, phone  & computer access, showers, AA/NA mtgs, mental health/substance abuse nurse, job skills class, disability information, VA assistance, spiritual classes, etc.  ° °[facility] ° °[name]     °[name]   °[address], [place]     °[phone]       °       °[name] (women and children)       °[address] °[place], [place] [address] °336-275-0820 °[email or web address] for application and process °Application Required ° °[name]   °[address]    °[name] [address]     °[phone]       °             °[name] °[address] °[place],  [address] °[phone] °[phone](schedule application appt.) °Application Required ° °[name] (women only)    °[address]     °[name],  [address]     °[phone]      °  Intake starts 6pm daily °Need valid ID, SSC, & Police report °[name] °[address] °[name], [place] °[phone] °Application Required ° °[facility] (men only)     °[address]      °[name], [place]     °[phone]      ° °[name] °(Pregnant women only) °[address] °[place], [place] °[phone] ° °[name]      °[address]      °[name], [place] [address]     °[phone]      °       °[name] °[address] °[name], [place] °[phone] °90 day commitment/SA/Application process ° °[facility](men only)     °[address]     °[name], [place]     °[phone]       °Check-in at 7pm     °       °[name] °[address] °[place], [place] [address] °[phone] °Men/Women/Women and Children must be there by 7 pm ° °[name] °[name], [place] °[phone]                ° °

## 2016-01-23 NOTE — ED Notes (Signed)
Pt alert & oriented x4, stable gait. Patient given discharge instructions, paperwork & prescription(s). Patient verbalized understanding. Pt left department in wheelchair escorted by staff. Pt left w/ no further questions. 

## 2016-01-23 NOTE — ED Notes (Signed)
Pt states abdominal pain, HA, N/V since trying to detox. Last used 3 days ago.
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
## 2016-01-23 NOTE — ED Triage Notes (Signed)
Pt reports using 1.5 grams of Heroin daily for a few years. Pt states she has been attempting to detox herself, last use x 3 days ago. Pt reports severe abdominal pain/chills/n/v/sweats/anxiety.

## 2016-01-23 NOTE — ED Notes (Signed)
Pt resting w/ eyes closed. Family in room.

## 2016-04-25 ENCOUNTER — Encounter (INDEPENDENT_AMBULATORY_CARE_PROVIDER_SITE_OTHER): Payer: Self-pay | Admitting: Orthopaedic Surgery

## 2016-04-25 ENCOUNTER — Ambulatory Visit (INDEPENDENT_AMBULATORY_CARE_PROVIDER_SITE_OTHER): Payer: Self-pay

## 2016-04-25 ENCOUNTER — Ambulatory Visit (INDEPENDENT_AMBULATORY_CARE_PROVIDER_SITE_OTHER): Payer: Self-pay | Admitting: Orthopaedic Surgery

## 2016-04-25 DIAGNOSIS — M545 Low back pain, unspecified: Secondary | ICD-10-CM

## 2016-04-25 DIAGNOSIS — M25562 Pain in left knee: Secondary | ICD-10-CM

## 2016-04-25 MED ORDER — METHOCARBAMOL 500 MG PO TABS: 500.0000 mg | ORAL_TABLET | Freq: Three times a day (TID) | ORAL | 2 refills | Status: DC | PRN

## 2016-04-25 MED ORDER — HYDROCODONE-ACETAMINOPHEN 5-325 MG PO TABS: 1.0000 | ORAL_TABLET | Freq: Every day | ORAL | 0 refills | Status: DC | PRN

## 2016-04-25 MED ORDER — IBUPROFEN 800 MG PO TABS: 800.0000 mg | ORAL_TABLET | Freq: Three times a day (TID) | ORAL | 0 refills | Status: DC | PRN
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
## 2016-05-18 ENCOUNTER — Telehealth (INDEPENDENT_AMBULATORY_CARE_PROVIDER_SITE_OTHER): Payer: Self-pay | Admitting: *Deleted
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
## 2016-05-19 NOTE — Telephone Encounter (Signed)
Called pt no answer left voicemail. She needs to make an appt to see Dr Erlinda Hong

## 2016-05-29 ENCOUNTER — Ambulatory Visit (INDEPENDENT_AMBULATORY_CARE_PROVIDER_SITE_OTHER): Payer: Self-pay | Admitting: Orthopaedic Surgery

## 2016-06-06 ENCOUNTER — Ambulatory Visit (INDEPENDENT_AMBULATORY_CARE_PROVIDER_SITE_OTHER): Payer: Self-pay | Admitting: Orthopaedic Surgery

## 2016-06-06 ENCOUNTER — Ambulatory Visit (INDEPENDENT_AMBULATORY_CARE_PROVIDER_SITE_OTHER): Payer: Medicaid Other | Admitting: Orthopaedic Surgery

## 2016-06-06 ENCOUNTER — Encounter (INDEPENDENT_AMBULATORY_CARE_PROVIDER_SITE_OTHER): Payer: Self-pay | Admitting: Orthopaedic Surgery

## 2016-06-06 DIAGNOSIS — M545 Low back pain, unspecified: Secondary | ICD-10-CM

## 2016-06-06 DIAGNOSIS — M25562 Pain in left knee: Secondary | ICD-10-CM

## 2016-06-06 NOTE — Progress Notes (Signed)
   Office Visit Note   Patient: [name]           Date of Birth: 10-21-1975           MRN: [id number] Visit Date: 06/06/2016              Requested by: No referring provider defined for this encounter. PCP: No PCP Per Patient   Assessment & Plan: Visit Diagnoses:  1. Acute bilateral low back pain without sciatica   2. Acute pain of left knee     Plan: At this point I do not appreciate any pathology in her back or in her knee. I don't think MRI is indicated as it would likely show contusion to the knee and sprain ligaments of her spine. She will need to obtain a medicines from her primary care doctor if she needs additional refills follow up with me as needed  Follow-Up Instructions: Return if symptoms worsen or fail to improve.   Orders:  No orders of the defined types were placed in this encounter.  No orders of the defined types were placed in this encounter.     Procedures: No procedures performed   Clinical Data: No additional findings.   Subjective: Chief Complaint  Patient presents with  . Left Knee - Pain  . Lower Back - Pain    Patient follows up for her left knee pain and low back pain. She is she has chronic pain that is worse with sleeping worse in the morning she has constant pain. She is doing home exercises. She will like to have more pain medicines. Denies any new numbness or symptoms    Review of Systems   Objective: Vital Signs: There were no vitals taken for this visit.  Physical Exam  Ortho Exam Exam is stable and non-focal Specialty Comments:  No specialty comments available.  Imaging: No results found.   PMFS History: Patient Active Problem List   Diagnosis Date Noted  . GERD (gastroesophageal reflux disease) 10/27/2010  . Epigastric pain 10/16/2010  . Diarrhea 10/16/2010   Past Medical History:  Diagnosis Date  . Anxiety   . Cervical ca ([place])    [id number]  . Enterocolitis    admission 4/14-4/18  . GERD  (gastroesophageal reflux disease)   . Heroin addiction ([place])     Family History  Problem Relation Age of Onset  . GER disease Father   . GER disease Mother   . Colon polyps Mother   . Diverticulosis Mother     Past Surgical History:  Procedure Laterality Date  . CERVICAL CONE BIOPSY    . COLONOSCOPY  11/14/2010   [id number] polyps ablated and/or snared from the rectum, otherwise normal-appearing rectum and colon. Erosions in the terminal ileum status post biopsy   . ESOPHAGOGASTRODUODENOSCOPY  11/14/2010   [id number] distal esophageal erosions consistent with mild erosive reflux esophagitis  . LAPAROSCOPIC CHOLECYSTECTOMY     Social History   Occupational History  . Not on file.   Social History Main Topics  . Smoking status: Current Every Day Smoker    Packs/day: 1.00    Years: 15.00  . Smokeless tobacco: Never Used     Comment: PT STATES SHE IS TRYING TO QUIT  . Alcohol use No  . Drug use:     Types: IV  . Sexual activity: Not on file     Comment: HEROIN

## 2017-02-07 ENCOUNTER — Ambulatory Visit: Payer: Medicaid Other | Admitting: Family Medicine

## 2017-02-19 ENCOUNTER — Ambulatory Visit: Payer: Medicaid Other | Admitting: Family Medicine

## 2017-02-22 ENCOUNTER — Ambulatory Visit: Payer: Self-pay | Admitting: Family Medicine

## 2017-02-23 ENCOUNTER — Ambulatory Visit (INDEPENDENT_AMBULATORY_CARE_PROVIDER_SITE_OTHER): Payer: Self-pay | Admitting: Family Medicine

## 2017-02-23 ENCOUNTER — Encounter: Payer: Self-pay | Admitting: Family Medicine

## 2017-02-23 VITALS — BP 110/70 | HR 88 | Temp 98.4°F | Ht 64.0 in | Wt 141.0 lb

## 2017-02-23 DIAGNOSIS — G894 Chronic pain syndrome: Secondary | ICD-10-CM | POA: Insufficient documentation

## 2017-02-23 DIAGNOSIS — F4310 Post-traumatic stress disorder, unspecified: Secondary | ICD-10-CM | POA: Insufficient documentation

## 2017-02-23 DIAGNOSIS — Z720 Tobacco use: Secondary | ICD-10-CM

## 2017-02-23 MED ORDER — DULOXETINE HCL 60 MG PO CPEP: 60.0000 mg | ORAL_CAPSULE | Freq: Every day | ORAL | 3 refills | Status: DC
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
## 2017-02-23 NOTE — Assessment & Plan Note (Addendum)
Symptoms consistent with PTSD given traumatic event, flashbacks, avoidance symptoms, and nightmares. She may also have another underlying anxiety disorder, though less likely. While an SSRI would typically be first line, will start with cymbalta due to its chronic pain properties. Also strongly recommended psychotherapy - patient stated she would look into.   Follow up in 2-3 weeks.
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
## 2017-02-23 NOTE — Assessment & Plan Note (Signed)
Smoking cessation strongly encouraged today. Patient is currently trying to quit.

## 2017-02-23 NOTE — Patient Instructions (Signed)
Start the cymbalta.  Schedule an appointment with Lattie Haw if able.  Come back in 2-3 weeks.  Take care,  Dr Jerline Pain

## 2017-03-02 ENCOUNTER — Telehealth: Payer: Self-pay | Admitting: Family Medicine

## 2017-03-02 NOTE — Telephone Encounter (Signed)
Please let patient know that she was diagnosed with chronic pain syndrome and PTSD.  Algis Greenhouse. Jerline Pain, MD 03/02/2017 3:20 PM

## 2017-03-02 NOTE — Telephone Encounter (Signed)
Patient is aware of dx from last visit per Dr. Jerline Pain

## 2017-03-02 NOTE — Telephone Encounter (Signed)
Patient called in reference to last visit and her diagnosis. Patient was not sure if she was "diagnosed" with anything and wanted to speak with someone on this to be clear. Please call patient and advise. OK to leave message.

## 2017-03-16 ENCOUNTER — Ambulatory Visit: Payer: Self-pay | Admitting: Family Medicine
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
## 2017-07-17 ENCOUNTER — Other Ambulatory Visit: Payer: Self-pay

## 2017-07-17 ENCOUNTER — Telehealth: Payer: Self-pay | Admitting: Family Medicine

## 2017-07-17 MED ORDER — DULOXETINE HCL 60 MG PO CPEP: 60.0000 mg | ORAL_CAPSULE | Freq: Every day | ORAL | 0 refills | Status: AC

## 2017-07-17 NOTE — Telephone Encounter (Signed)
Please contact patient to advise on the note below. °

## 2017-07-17 NOTE — Telephone Encounter (Signed)
1 month supply sent.  Patient will need follow up visit for further refills.

## 2017-07-17 NOTE — Telephone Encounter (Signed)
Copied from [place] [id number]. Topic: Quick Communication - Rx Refill/Question >> Jul 17, 2017 12:17 PM Oliver Pila B wrote: Medication: DULoxetine (CYMBALTA) 60 MG capsule [[id number]]  Pt called b/c she is needing a refill on the medication, pt states she is self pay and if she doesn't have to come in to the office that would be great, contact pt to advise

## 2018-05-15 ENCOUNTER — Encounter (HOSPITAL_COMMUNITY): Payer: Self-pay

## 2018-05-15 ENCOUNTER — Emergency Department (HOSPITAL_COMMUNITY)
Admission: EM | Admit: 2018-05-15 | Discharge: 2018-05-15 | Disposition: A | Discharge status: Home / Self Care | Payer: Self-pay | Attending: Emergency Medicine | Admitting: Emergency Medicine

## 2018-05-15 DIAGNOSIS — F17200 Nicotine dependence, unspecified, uncomplicated: Secondary | ICD-10-CM | POA: Insufficient documentation

## 2018-05-15 DIAGNOSIS — Z79899 Other long term (current) drug therapy: Secondary | ICD-10-CM | POA: Insufficient documentation

## 2018-05-15 DIAGNOSIS — G5631 Lesion of radial nerve, right upper limb: Secondary | ICD-10-CM | POA: Insufficient documentation

## 2018-05-15 DIAGNOSIS — Z8541 Personal history of malignant neoplasm of cervix uteri: Secondary | ICD-10-CM | POA: Insufficient documentation

## 2018-05-15 NOTE — Discharge Instructions (Addendum)
You were seen today for right arm weakness and numbness.  This is consistent with Saturday night palsy.  He will likely have some residual weakness for weeks to months.  Wear a wrist splint for comfort.  You may benefit from physical or occupational therapy.  Follow-up with neurology.

## 2018-05-15 NOTE — ED Notes (Signed)
PT states understanding of care given, follow up care. PT ambulated from ED to car with a steady gait.  

## 2018-05-15 NOTE — ED Provider Notes (Signed)
[facility] Provider Note   CSN: [id number] Arrival date & time: 05/15/18  0044     History   Chief Complaint Chief Complaint  Patient presents with  . arm numbness    HPI [name] is a 42 y.o. female.  HPI  This a 42 year old female with a history of cervical cancer, reflux, opioid addiction who presents with right arm weakness and numbness.  Patient reports that approximately 3 weeks ago she fell asleep on her right side.  She states that she woke up and had pins and needle sensation of both her arm and her leg.  No neck pain.  She states that her leg recovered in approximately 10 minutes.  However, she had persistent tingling in her hand which has persisted and mostly in her forearm and thumb.  She is unable to extend her wrists or her fingers.  She is right-handed and has difficulty with fine motor task in that hand.  She does report that she drinks 2 mixed drinks that night and was very tired and slept very soundly.  She does have a history of heroin abuse but states that she has been clean for the last 6 weeks.  She denies any fevers.  Past Medical History:  Diagnosis Date  . Anxiety   . Cervical ca ([place])    [id number]  . Enterocolitis    admission 4/14-4/18  . GERD (gastroesophageal reflux disease)   . Heroin addiction [facility])     Patient Active Problem List   Diagnosis Date Noted  . Chronic pain syndrome 02/23/2017  . PTSD (post-traumatic stress disorder) 02/23/2017  . Tobacco abuse 02/23/2017  . GERD (gastroesophageal reflux disease) 10/27/2010    Past Surgical History:  Procedure Laterality Date  . CERVICAL CONE BIOPSY    . COLONOSCOPY  11/14/2010   OFB:[id number] polyps ablated and/or snared from the rectum, otherwise normal-appearing rectum and colon. Erosions in the terminal ileum status post biopsy   . ESOPHAGOGASTRODUODENOSCOPY  11/14/2010   RXV:QMGQ distal esophageal erosions consistent with mild  erosive reflux esophagitis  . LAPAROSCOPIC CHOLECYSTECTOMY       OB History   None      Home Medications    Prior to Admission medications   Medication Sig Start Date End Date Taking? Authorizing Provider  ascorbic acid (VITAMIN C) 250 MG CHEW Chew 500 mg by mouth daily.    [provider]  DULoxetine (CYMBALTA) 60 MG capsule Take 1 capsule (60 mg total) by mouth daily. 07/17/17   Vivi Barrack, MD  esomeprazole (NEXIUM) 20 MG capsule Take 80 mg by mouth daily at 12 noon.     [provider]  ibuprofen (ADVIL,MOTRIN) 200 MG tablet Take 800 mg by mouth every 6 (six) hours as needed.    [provider]    Family History Family History  Problem Relation Age of Onset  . GER disease Father   . GER disease Mother   . Colon polyps Mother   . Diverticulosis Mother     Social History Social History   Tobacco Use  . Smoking status: Current Every Day Smoker    Packs/day: 0.50    Years: 15.00    Pack years: 7.50  . Smokeless tobacco: Never Used  . Tobacco comment: PT STATES SHE IS TRYING TO QUIT  Substance Use Topics  . Alcohol use: No  . Drug use: Yes    Types: IV    Comment: a month and half off heorin  Allergies   Patient has no known allergies.   Review of Systems Review of Systems  Constitutional: Negative for fever.  Musculoskeletal: Negative for neck pain.  Neurological: Positive for weakness and numbness. Negative for facial asymmetry and speech difficulty.  All other systems reviewed and are negative.    Physical Exam Updated Vital Signs BP (!) 148/97 (BP Location: Left Arm)   Pulse 98   Temp 98.7 F (37.1 C) (Oral)   Resp 18   Ht 1.651 m (5\' 5" )   Wt 49.9 kg   LMP 05/09/2018 (Approximate)   SpO2 100%   BMI 18.30 kg/m   Physical Exam  Constitutional: She is oriented to person, place, and time. She appears well-developed and well-nourished. No distress.  HENT:  Head: Normocephalic and atraumatic.  Eyes: Pupils  are equal, round, and reactive to light.  Cardiovascular: Normal rate and regular rhythm.  Pulmonary/Chest: Effort normal. No respiratory distress.  Neurological: She is alert and oriented to person, place, and time.  Cranial nerves II through XII intact, 5 out of 5 biceps, triceps, deltoid joints bilaterally, patient with difficulty with wrist extension and finger extension on the right 1 out of 5, patient is unable to give a thumbs up, she can flex her wrist and her fingers but not full grip strength 4+/5  Skin: Skin is warm and dry.  Psychiatric: She has a normal mood and affect.  Nursing note and vitals reviewed.    ED Treatments / Results  Labs (all labs ordered are listed, but only abnormal results are displayed) Labs Reviewed - No data to display  EKG None  Radiology No results found.  Procedures Procedures (including critical care time)  Medications Ordered in ED Medications - No data to display   Initial Impression / Assessment and Plan / ED Course  I have reviewed the triage vital signs and the nursing notes.  Pertinent labs & imaging results that were available during my care of the patient were reviewed by me and considered in my medical decision making (see chart for details).     Patient presents with numbness and weakness mostly in the right forearm and wrist and fingers.  It localizes peripherally to a portion of the radial nerve most likely C7-C8.  She did report some leg numbness initially but this lasted for 10 minutes.  She denies any neck pain.  My suspicion is this is a peripheral palsy specifically Saturday night palsy of a portion of the radial nerve on the right side.  Doubt stroke, doubt cervical cord compression.  Patient was advised that this would take likely months to improve.  She may benefit from physical or occupational therapy.  She should also see neurology as an outpatient.  At this time I do not feel any imaging is warranted.  Patient stated  understanding.  After history, exam, and medical workup I feel the patient has been appropriately medically screened and is safe for discharge home. Pertinent diagnoses were discussed with the patient. Patient was given return precautions.   Final Clinical Impressions(s) / ED Diagnoses   Final diagnoses:  Saturday night palsy, right    ED Discharge Orders    None       Sean Macwilliams, Barbette Hair, MD 05/15/18 [phone]

## 2018-05-15 NOTE — ED Triage Notes (Signed)
Pt states about 3 weeks ago went to sleep on the right side and woke up the next morning with the arm numb from the elbow down to the hand and hand is facing inward towards pt. Pt can't lift the hand or straighten her fingers without using the other hand.
